# Patient Record
Sex: Male | Born: 1970 | Race: White | Hispanic: No | Marital: Married | Smoking: Current every day smoker
Health system: Southern US, Community
[De-identification: ages and names within clinical notes are randomized; demographics above are authoritative.]

## PROBLEM LIST (undated history)

## (undated) DIAGNOSIS — E119 Type 2 diabetes mellitus without complications: Secondary | ICD-10-CM

---

## 2013-06-02 ENCOUNTER — Encounter (HOSPITAL_COMMUNITY): Payer: Self-pay | Admitting: Emergency Medicine

## 2013-06-02 ENCOUNTER — Emergency Department (HOSPITAL_COMMUNITY)
Admission: EM | Admit: 2013-06-02 | Discharge: 2013-06-02 | Disposition: A | Discharge status: Home / Self Care | Payer: Self-pay | Attending: Emergency Medicine | Admitting: Emergency Medicine

## 2013-06-02 DIAGNOSIS — IMO0002 Reserved for concepts with insufficient information to code with codable children: Secondary | ICD-10-CM | POA: Insufficient documentation

## 2013-06-02 DIAGNOSIS — L03115 Cellulitis of right lower limb: Secondary | ICD-10-CM

## 2013-06-02 DIAGNOSIS — L02419 Cutaneous abscess of limb, unspecified: Secondary | ICD-10-CM | POA: Insufficient documentation

## 2013-06-02 DIAGNOSIS — L03119 Cellulitis of unspecified part of limb: Secondary | ICD-10-CM

## 2013-06-02 DIAGNOSIS — L03113 Cellulitis of right upper limb: Secondary | ICD-10-CM

## 2013-06-02 DIAGNOSIS — F17200 Nicotine dependence, unspecified, uncomplicated: Secondary | ICD-10-CM | POA: Insufficient documentation

## 2013-06-02 MED ORDER — SULFAMETHOXAZOLE-TMP DS 800-160 MG PO TABS
1.0000 | ORAL_TABLET | Freq: Once | ORAL | Status: AC
  Administered 2013-06-02: 1 via ORAL
  Filled 2013-06-02: qty 1

## 2013-06-02 MED ORDER — CEPHALEXIN 250 MG PO CAPS
250.0000 mg | ORAL_CAPSULE | Freq: Once | ORAL | Status: AC
  Administered 2013-06-02: 250 mg via ORAL
  Filled 2013-06-02: qty 1

## 2013-06-02 MED ORDER — SULFAMETHOXAZOLE-TRIMETHOPRIM 800-160 MG PO TABS: 1.0000 | ORAL_TABLET | Freq: Two times a day (BID) | ORAL | Status: DC

## 2013-06-02 MED ORDER — CEPHALEXIN 500 MG PO CAPS: 500.0000 mg | ORAL_CAPSULE | Freq: Four times a day (QID) | ORAL | Status: DC

## 2013-06-02 NOTE — ED Notes (Signed)
Pt woke up with "spider bites" on R wrist and R knee. Areas reddened and swollen. Pt states pain feels like pressure, no itching.

## 2013-06-02 NOTE — ED Provider Notes (Signed)
CSN: [id number]     Arrival date & time 06/02/13  [id number] History  This chart was scribed for non-physician practitioner Emilia BeckKaitlyn Takao Lizer, working with Toy BakerAnthony T Allen, MD by Carl Bestelina Holson, ED Scribe. This patient was seen in room WTR9/WTR9 and the patient's care was started at 8:12 PM.    Chief Complaint  Patient presents with  . Wound Check   Patient is a 43 y.o. male presenting with wound check. The history is provided by the patient. No language interpreter was used.  Wound Check   HPI Comments: Melody Haverimothy R Widen is a 43 y.o. male who presents to the Emergency Department complaining of two painful spider bites located on his right wrist and right knee that he noticed this morning when he woke up.  The patient denies seeing anything bite him.  The patient states that the bites are not itchy or spreading.  He denies fever as an associated symptom.   History reviewed. No pertinent past medical history. History reviewed. No pertinent past surgical history. History reviewed. No pertinent family history. History  Substance Use Topics  . Smoking status: Current Every Day Smoker  . Smokeless tobacco: Not on file  . Alcohol Use: No    Review of Systems  Constitutional: Negative for fever.  Skin: Positive for wound. Negative for rash.  All other systems reviewed and are negative.     Allergies  Review of patient's allergies indicates not on file.  Home Medications   Prior to Admission medications   Not on File   Triage Vitals: BP 154/86  Pulse 104  Temp(Src) 98.4 F (36.9 C) (Oral)  Resp 16  SpO2 100%  Physical Exam  Nursing note and vitals reviewed. Constitutional: He is oriented to person, place, and time. He appears well-developed and well-nourished.  HENT:  Head: Normocephalic and atraumatic.  Eyes: EOM are normal.  Neck: Normal range of motion.  Cardiovascular: Normal rate.   Pulmonary/Chest: Effort normal.  Musculoskeletal: Normal range of motion.  Neurological:  He is alert and oriented to person, place, and time.  Skin: Skin is warm and dry.  4x4 cm area of erythema, localized edema and tenderness to palpation with central scab. No red streaking from the affected area.  No drainage noted.  No open wound.    Psychiatric: He has a normal mood and affect. His behavior is normal.    ED Course  Procedures (including critical care time)  DIAGNOSTIC STUDIES: Oxygen Saturation is 100% on room air, normal by my interpretation.    COORDINATION OF CARE: 8:14 PM- Discussed a clinical suspicion of cellulitis and discharging the patient with Bactrim and Keflex.  The patient denied wanting pain medication.  The patient agreed to the treatment plan.    Labs Review Labs Reviewed - No data to display  Imaging Review No results found.   EKG Interpretation None      MDM   Final diagnoses:  Cellulitis of arm, right  Cellulitis of leg, right    Patient has cellulitis of right arm and right leg. Patient will be discharged with bactrim and keflex. Patient advised to return with worsening or concerning symptoms. Vitals stable and patient afebrile.   I personally performed the services described in this documentation, which was scribed in my presence. The recorded information has been reviewed and is accurate.    Emilia BeckKaitlyn Javad Salva, PA-C 06/02/13 2254

## 2013-06-02 NOTE — Discharge Instructions (Signed)
Take Bactrim and Keflex as directed until gone. Refer to attached documents for more information. Return to the ED with worsening or concerning symptoms.  °

## 2013-06-02 NOTE — ED Provider Notes (Signed)
Medical screening examination/treatment/procedure(s) were performed by non-physician practitioner and as supervising physician I was immediately available for consultation/collaboration.  Shanteria Laye T Trenee Igoe, MD 06/02/13 2309 

## 2013-08-09 ENCOUNTER — Emergency Department (HOSPITAL_COMMUNITY): Payer: Self-pay

## 2013-08-09 ENCOUNTER — Encounter (HOSPITAL_COMMUNITY): Payer: Self-pay | Admitting: Emergency Medicine

## 2013-08-09 ENCOUNTER — Emergency Department (HOSPITAL_COMMUNITY)
Admission: EM | Admit: 2013-08-09 | Discharge: 2013-08-09 | Disposition: A | Discharge status: Home / Self Care | Payer: Self-pay | Source: Home / Self Care | Attending: Emergency Medicine | Admitting: Emergency Medicine

## 2013-08-09 ENCOUNTER — Emergency Department (HOSPITAL_COMMUNITY)
Admission: EM | Admit: 2013-08-09 | Discharge: 2013-08-09 | Disposition: A | Discharge status: Home / Self Care | Payer: Self-pay | Attending: Emergency Medicine | Admitting: Emergency Medicine

## 2013-08-09 ENCOUNTER — Inpatient Hospital Stay (HOSPITAL_COMMUNITY)
Admission: EM | Admit: 2013-08-09 | Discharge: 2013-08-15 | DRG: 854 | Disposition: A | Discharge status: Home / Self Care | Payer: Self-pay | Attending: Internal Medicine | Admitting: Internal Medicine

## 2013-08-09 DIAGNOSIS — K5900 Constipation, unspecified: Secondary | ICD-10-CM | POA: Diagnosis present

## 2013-08-09 DIAGNOSIS — M86149 Other acute osteomyelitis, unspecified hand: Secondary | ICD-10-CM

## 2013-08-09 DIAGNOSIS — E1165 Type 2 diabetes mellitus with hyperglycemia: Secondary | ICD-10-CM | POA: Diagnosis present

## 2013-08-09 DIAGNOSIS — A419 Sepsis, unspecified organism: Secondary | ICD-10-CM | POA: Diagnosis present

## 2013-08-09 DIAGNOSIS — B951 Streptococcus, group B, as the cause of diseases classified elsewhere: Secondary | ICD-10-CM | POA: Diagnosis present

## 2013-08-09 DIAGNOSIS — IMO0001 Reserved for inherently not codable concepts without codable children: Secondary | ICD-10-CM

## 2013-08-09 DIAGNOSIS — E1169 Type 2 diabetes mellitus with other specified complication: Secondary | ICD-10-CM

## 2013-08-09 DIAGNOSIS — M86141 Other acute osteomyelitis, right hand: Secondary | ICD-10-CM

## 2013-08-09 DIAGNOSIS — D72829 Elevated white blood cell count, unspecified: Secondary | ICD-10-CM | POA: Diagnosis present

## 2013-08-09 DIAGNOSIS — A4102 Sepsis due to Methicillin resistant Staphylococcus aureus: Principal | ICD-10-CM | POA: Diagnosis present

## 2013-08-09 DIAGNOSIS — M869 Osteomyelitis, unspecified: Secondary | ICD-10-CM | POA: Diagnosis present

## 2013-08-09 DIAGNOSIS — IMO0002 Reserved for concepts with insufficient information to code with codable children: Secondary | ICD-10-CM | POA: Diagnosis present

## 2013-08-09 DIAGNOSIS — L02519 Cutaneous abscess of unspecified hand: Secondary | ICD-10-CM | POA: Diagnosis present

## 2013-08-09 DIAGNOSIS — L089 Local infection of the skin and subcutaneous tissue, unspecified: Secondary | ICD-10-CM

## 2013-08-09 DIAGNOSIS — E119 Type 2 diabetes mellitus without complications: Secondary | ICD-10-CM | POA: Diagnosis present

## 2013-08-09 DIAGNOSIS — R739 Hyperglycemia, unspecified: Secondary | ICD-10-CM

## 2013-08-09 DIAGNOSIS — F17200 Nicotine dependence, unspecified, uncomplicated: Secondary | ICD-10-CM | POA: Insufficient documentation

## 2013-08-09 DIAGNOSIS — L03119 Cellulitis of unspecified part of limb: Secondary | ICD-10-CM

## 2013-08-09 DIAGNOSIS — M9080 Osteopathy in diseases classified elsewhere, unspecified site: Secondary | ICD-10-CM | POA: Diagnosis present

## 2013-08-09 DIAGNOSIS — I96 Gangrene, not elsewhere classified: Secondary | ICD-10-CM | POA: Diagnosis present

## 2013-08-09 DIAGNOSIS — L98499 Non-pressure chronic ulcer of skin of other sites with unspecified severity: Secondary | ICD-10-CM | POA: Diagnosis present

## 2013-08-09 DIAGNOSIS — Z6833 Body mass index (BMI) 33.0-33.9, adult: Secondary | ICD-10-CM

## 2013-08-09 HISTORY — DX: Type 2 diabetes mellitus without complications: E11.9

## 2013-08-09 LAB — URINALYSIS, ROUTINE W REFLEX MICROSCOPIC
Bilirubin Urine: NEGATIVE
Hgb urine dipstick: NEGATIVE
Ketones, ur: NEGATIVE mg/dL
LEUKOCYTES UA: NEGATIVE
Nitrite: NEGATIVE
PROTEIN: NEGATIVE mg/dL
SPECIFIC GRAVITY, URINE: 1.039 — AB (ref 1.005–1.030)
Urobilinogen, UA: 0.2 mg/dL (ref 0.0–1.0)
pH: 5.5 (ref 5.0–8.0)

## 2013-08-09 LAB — CBC WITH DIFFERENTIAL/PLATELET
Basophils Absolute: 0 10*3/uL (ref 0.0–0.1)
Basophils Relative: 0 % (ref 0–1)
EOS ABS: 0.2 10*3/uL (ref 0.0–0.7)
Eosinophils Relative: 1 % (ref 0–5)
HEMATOCRIT: 41.5 % (ref 39.0–52.0)
Hemoglobin: 14.4 g/dL (ref 13.0–17.0)
Lymphocytes Relative: 17 % (ref 12–46)
Lymphs Abs: 3.1 10*3/uL (ref 0.7–4.0)
MCH: 28.5 pg (ref 26.0–34.0)
MCHC: 34.7 g/dL (ref 30.0–36.0)
MCV: 82 fL (ref 78.0–100.0)
MONO ABS: 1 10*3/uL (ref 0.1–1.0)
Monocytes Relative: 5 % (ref 3–12)
Neutro Abs: 14.2 10*3/uL — ABNORMAL HIGH (ref 1.7–7.7)
Neutrophils Relative %: 77 % (ref 43–77)
PLATELETS: 432 10*3/uL — AB (ref 150–400)
RBC: 5.06 MIL/uL (ref 4.22–5.81)
RDW: 12.9 % (ref 11.5–15.5)
WBC: 18.4 10*3/uL — ABNORMAL HIGH (ref 4.0–10.5)

## 2013-08-09 LAB — COMPREHENSIVE METABOLIC PANEL
ALT: 20 U/L (ref 0–53)
ANION GAP: 18 — AB (ref 5–15)
AST: 12 U/L (ref 0–37)
Albumin: 3.4 g/dL — ABNORMAL LOW (ref 3.5–5.2)
Alkaline Phosphatase: 115 U/L (ref 39–117)
BUN: 13 mg/dL (ref 6–23)
CO2: 20 mEq/L (ref 19–32)
CREATININE: 0.54 mg/dL (ref 0.50–1.35)
Calcium: 9.5 mg/dL (ref 8.4–10.5)
Chloride: 90 mEq/L — ABNORMAL LOW (ref 96–112)
GFR calc Af Amer: >90 mL/min (ref >90)
GLUCOSE: 525 mg/dL — AB (ref 70–99)
Potassium: 4.4 mEq/L (ref 3.7–5.3)
SODIUM: 128 meq/L — AB (ref 137–147)
TOTAL PROTEIN: 7.6 g/dL (ref 6.0–8.3)
Total Bilirubin: <0.2 mg/dL — ABNORMAL LOW (ref 0.3–1.2)

## 2013-08-09 LAB — RAPID URINE DRUG SCREEN, HOSP PERFORMED
AMPHETAMINES: POSITIVE — AB
Barbiturates: NOT DETECTED
Benzodiazepines: NOT DETECTED
Cocaine: NOT DETECTED
OPIATES: NOT DETECTED
Tetrahydrocannabinol: POSITIVE — AB

## 2013-08-09 LAB — URINE MICROSCOPIC-ADD ON

## 2013-08-09 LAB — HEMOGLOBIN A1C
Hgb A1c MFr Bld: 13.5 % — ABNORMAL HIGH (ref <5.7)
MEAN PLASMA GLUCOSE: 341 mg/dL — AB (ref <117)

## 2013-08-09 LAB — CBG MONITORING, ED: GLUCOSE-CAPILLARY: 397 mg/dL — AB (ref 70–99)

## 2013-08-09 MED ORDER — SODIUM CHLORIDE 0.9 % IV SOLN
INTRAVENOUS | Status: DC
  Administered 2013-08-09: 1000 mL via INTRAVENOUS

## 2013-08-09 MED ORDER — SODIUM CHLORIDE 0.9 % IV BOLUS (SEPSIS)
1000.0000 mL | Freq: Once | INTRAVENOUS | Status: AC
  Administered 2013-08-09: 1000 mL via INTRAVENOUS

## 2013-08-09 MED ORDER — INSULIN ASPART 100 UNIT/ML ~~LOC~~ SOLN
12.0000 [IU] | Freq: Once | SUBCUTANEOUS | Status: DC
  Filled 2013-08-09: qty 1

## 2013-08-09 MED ORDER — PIPERACILLIN-TAZOBACTAM 3.375 G IVPB
3.3750 g | Freq: Once | INTRAVENOUS | Status: AC
  Administered 2013-08-09: 3.375 g via INTRAVENOUS
  Filled 2013-08-09: qty 50

## 2013-08-09 NOTE — ED Notes (Signed)
Pt here for redness and swelling to right middle finger, index finger and hand. Pain with some swelling for one month, patient tried to lance his middle finger himself.

## 2013-08-09 NOTE — ED Notes (Signed)
Pt seen earlier today for finger swelling x 1 month. Pt eloped from ED with IV in place. Pt returned. States he got too thirsty and left. IV still in place.

## 2013-08-09 NOTE — ED Notes (Signed)
PT left, refused further evaluation, instructions or vitals.

## 2013-08-09 NOTE — ED Notes (Signed)
Pt states he was [facility] for infected 3rd finger on R hand. Pt states he was advised to stay and be admitted and have finger debrided, but pt states he left AMA because he had to go home. Pt states now he is back and wants to have his finger taken care of. Pt [facility]. Pt has swelling and redness to R hand. Pt arrives with dressing to 3rd finger on R hand.

## 2013-08-09 NOTE — Evaluation (Signed)
Was initially called to admit pt. Re: R finger cellulitis and new onset DM (blood sugar 525).  Was evaluated by ortho earlier today for likely OR evaluation.  Was notified by staff that pt left AMA prior to my admission/evaluation.  Per report, this is the second time today that pt has left the hospital AGAINST MEDICAL ADVICE.

## 2013-08-09 NOTE — ED Provider Notes (Addendum)
CSN: [id number]     Arrival date & time 08/09/13  1135 History   First MD Initiated Contact with Patient 08/09/13 1205     Chief Complaint  Patient presents with  . Hand Pain     (Consider location/radiation/quality/duration/timing/severity/associated sxs/prior Treatment) Patient is a 43 y.o. male presenting with hand pain. The history is provided by the patient.  Hand Pain Pertinent negatives include no chest pain, no abdominal pain, no headaches and no shortness of breath.  pt c/o right middle finger infection for the past month. States started as swelling and erythema to distal aspect of finger.  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  Pt states he attempted to lance himself several weeks ago, but since then pain and swelling have been persistent/worse.  States in past few days, now redness and swelling extend to adjacent index finger and distal hand.  Pain moderate, constant, worse w palpation. No numbness.  No hx diabetes or other chronic illness. No fever or chills. Is right hand dominant.     History reviewed. No pertinent past medical history. History reviewed. No pertinent past surgical history. History reviewed. No pertinent family history. History  Substance Use Topics  . Smoking status: Current Every Day Smoker  . Smokeless tobacco: Not on file  . Alcohol Use: No    Review of Systems  Constitutional: Negative for fever and chills.  HENT: Negative for sore throat.   Eyes: Negative for redness.  Respiratory: Negative for shortness of breath.   Cardiovascular: Negative for chest pain.  Gastrointestinal: Negative for nausea, vomiting and abdominal pain.  Genitourinary: Negative for flank pain.  Musculoskeletal: Negative for back pain and neck pain.  Skin: Negative for rash.  Neurological: Negative for headaches.  Hematological: Does not bruise/bleed easily.  Psychiatric/Behavioral: Negative for confusion.       Allergies  Review of patient's allergies indicates no known allergies.  Home Medications   Prior to Admission medications   Medication Sig Start Date End Date Taking? Authorizing Provider  Naproxen Sodium (ALEVE PO) Take 2 tablets by mouth daily as needed (pain).   Yes Historical Provider, MD  Neomycin-Bacitracin-Polymyxin (NEOSPORIN EX) Apply 1 application topically daily as needed (wound on finger).   Yes Historical Provider, MD   BP 159/93  Pulse 113  Temp(Src) 97.6 F (36.4 C) (Oral)  Resp 20  Wt 245 lb (111.131 kg)  SpO2 96% Physical Exam  Nursing note and vitals reviewed. Constitutional: He is oriented to person, place, and time. He appears well-developed and well-nourished. No distress.  HENT:  Mouth/Throat: Oropharynx is clear and moist.  Eyes: Conjunctivae are normal. No scleral icterus.  Neck: Neck supple. No tracheal deviation present.  Cardiovascular: Normal rate, regular rhythm, normal heart sounds and intact distal pulses.   Pulmonary/Chest: Effort normal and breath sounds normal. No accessory muscle usage. No respiratory distress.  Abdominal: Soft. He exhibits no distension. There is no tenderness.  Musculoskeletal: Normal range of motion.  Diffusely swollen, tender, and erythematous right middle finger.  Ulceration and scabs to distal phalanx of finger. Finger w marked pain w rom. Erythema, swelling and tenderness extend to base of right index finger and distal 2nd-4th metacarpals.   Intact cap refill distally.   Neurological: He is alert and oriented to person, place, and time.  Skin: Skin is warm and dry. He is not diaphoretic.  Psychiatric: He has a normal mood and affect.    ED Course  Procedures (  including critical care time) Labs Review  Results for orders placed during the hospital encounter of 08/09/13  CBC WITH DIFFERENTIAL      Result Value Ref Range   WBC 18.4 (*) 4.0 - 10.5 K/uL   RBC 5.06  4.22 - 5.81 MIL/uL   Hemoglobin 14.4  13.0 -  17.0 g/dL   HCT 40.9  81.1 - 91.4 %   MCV 82.0  78.0 - 100.0 fL   MCH 28.5  26.0 - 34.0 pg   MCHC 34.7  30.0 - 36.0 g/dL   RDW 78.2  95.6 - 21.3 %   Platelets 432 (*) 150 - 400 K/uL   Neutrophils Relative % 77  43 - 77 %   Neutro Abs 14.2 (*) 1.7 - 7.7 K/uL   Lymphocytes Relative 17  12 - 46 %   Lymphs Abs 3.1  0.7 - 4.0 K/uL   Monocytes Relative 5  3 - 12 %   Monocytes Absolute 1.0  0.1 - 1.0 K/uL   Eosinophils Relative 1  0 - 5 %   Eosinophils Absolute 0.2  0.0 - 0.7 K/uL   Basophils Relative 0  0 - 1 %   Basophils Absolute 0.0  0.0 - 0.1 K/uL  COMPREHENSIVE METABOLIC PANEL      Result Value Ref Range   Sodium 128 (*) 137 - 147 mEq/L   Potassium 4.4  3.7 - 5.3 mEq/L   Chloride 90 (*) 96 - 112 mEq/L   CO2 20  19 - 32 mEq/L   Glucose, Bld 525 (*) 70 - 99 mg/dL   BUN 13  6 - 23 mg/dL   Creatinine, Ser 0.86  0.50 - 1.35 mg/dL   Calcium 9.5  8.4 - 57.8 mg/dL   Total Protein 7.6  6.0 - 8.3 g/dL   Albumin 3.4 (*) 3.5 - 5.2 g/dL   AST 12  0 - 37 U/L   ALT 20  0 - 53 U/L   Alkaline Phosphatase 115  39 - 117 U/L   Total Bilirubin <0.2 (*) 0.3 - 1.2 mg/dL   GFR calc non Af Amer >90  >90 mL/min   GFR calc Af Amer >90  >90 mL/min   Anion gap 18 (*) 5 - 15   Dg Finger Middle Right  08/09/2013   CLINICAL DATA:  HAND PAIN  EXAM: RIGHT MIDDLE FINGER 2+V  COMPARISON:  None.  FINDINGS: There is no evidence of fracture or dislocation. The cortical margins appear intact. There is no evidence of arthropathy or other focal bone abnormality. A soft tissue ulceration along the distal tip of the third digit.  IMPRESSION: Soft tissue ulceration overlying the distal tuft of the third digit. There is no evidence of cortical destruction nor acute osseous abnormalities.   Electronically Signed   By: Salome Holmes M.D.   On: 08/09/2013 13:42     MDM  Iv ns. Labs. Xr.  Discussed w hand surgeon, Dr Melvyn Novas, he will see in ED and plan to take to OR for washout.  Pt kept npo.  Iv abx per hand  surgeon/when pt taken to OR.  Reviewed nursing notes and prior charts for additional history.   Pt updated on plan.   From labs, glucose elevated, additional ivf, insulin.  Pt denies hx dm.  hco3 normal. hgbaic added to labs.  Recheck, pt content, alert, in room, awaiting ortho/hand eval and OR.     Suzi Roots, MD 08/09/13 1513     On pts  return to ED arrangements made for admission re new onset dm/hyperglycemia, and plan to go that evening to OR w hand re finger and hand infection/washout.   Pt again elected to leave AMA.  Pt was alert, oriented x 3, appeared to be thinking clearly.  Patient did not appear intoxicated or delusional.  Risks of leaving including worsening infection, loss of and/or amputation of finger and hand, overwhelming whole body infection/sepsis, death, all discussed w pt.  Patient voices his understanding, and risks but insists on leaving AMA.  States he has friend who drove him and that he will be leaving to get into car, to [place] (states here just w work), and that [place] [facility], and that he will drive to hospital immediately once there.  Again tried to convince pt to stay here, get treatment initiated including OR, antibiotics, and control of hyperglycemia, and that then transition of care be made closer to home.  Patient refuses to stay here any longer or consider any other tx, stating that if we dont d/c his iv  In '60 seconds' he will rip it out and walk out.  Pt refusing recommended tx/inpatient stay, and leaves ama - pt unwilling to stay for dressings, paperwork, prescription, etc.        Suzi RootsKevin E Lashae Wollenberg, MD 08/12/13 [phone]

## 2013-08-09 NOTE — ED Notes (Signed)
Pt up to use bathroom. Pt left facility before being discharged. Pt unable to locate at the time.

## 2013-08-09 NOTE — ED Provider Notes (Addendum)
CSN: [id number]     Arrival date & time 08/09/13  1706 History   First MD Initiated Contact with Patient 08/09/13 1725     Chief Complaint  Patient presents with  . Finger Injury     (Consider location/radiation/quality/duration/timing/severity/associated sxs/prior Treatment) The history is provided by the patient.  pt c/o right middle finger infection for the past month. States started as swelling and erythema to distal aspect of finger. 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 Pt states he attempted to lance himself several weeks ago, but since then pain and swelling have been persistent/worse. States in past few days, now redness and swelling extend to adjacent index finger and distal hand. Pain moderate, constant, worse w palpation. No numbness. No hx diabetes or other chronic illness. No fever or chills. Is right hand dominant.   Pt was in ed earlier this afternoon w same, but had left ED ama without notifying staff, did eat/drink when gone, and now returns seeking completion of his care.     History reviewed. No pertinent past medical history. History reviewed. No pertinent past surgical history. No family history on file. History  Substance Use Topics  . Smoking status: Current Every Day Smoker  . Smokeless tobacco: Not on file  . Alcohol Use: No    Review of Systems  Constitutional: Negative for fever and chills.  HENT: Negative for sore throat.   Eyes: Negative for redness.  Respiratory: Negative for shortness of breath.   Cardiovascular: Negative for chest pain.  Gastrointestinal: Negative for vomiting, abdominal pain and diarrhea.  Genitourinary: Negative for flank pain.  Musculoskeletal: Negative for back pain and neck pain.  Skin: Negative for rash.  Neurological: Negative for headaches.  Hematological: Does not bruise/bleed easily.  Psychiatric/Behavioral: Negative for confusion.      Allergies    Review of patient's allergies indicates no known allergies.  Home Medications   Prior to Admission medications   Medication Sig Start Date End Date Taking? Authorizing Provider  Naproxen Sodium (ALEVE PO) Take 2 tablets by mouth daily as needed (pain).    Historical Provider, MD  Neomycin-Bacitracin-Polymyxin (NEOSPORIN EX) Apply 1 application topically daily as needed (wound on finger).    Historical Provider, MD   BP 148/86  Pulse 114  Temp(Src) 98.1 F (36.7 C) (Oral)  Resp 20  SpO2 96% Physical Exam  Nursing note and vitals reviewed. Constitutional: He is oriented to person, place, and time. He appears well-developed and well-nourished. No distress.  HENT:  Mouth/Throat: Oropharynx is clear and moist.  Eyes: Conjunctivae are normal. No scleral icterus.  Neck: Neck supple. No tracheal deviation present.  Cardiovascular: Normal rate, regular rhythm, normal heart sounds and intact distal pulses.   Pulmonary/Chest: Effort normal and breath sounds normal. No accessory muscle usage. No respiratory distress.  Abdominal: Soft. Bowel sounds are normal. He exhibits no distension. There is no tenderness.  Musculoskeletal: Normal range of motion.  Diffusely swollen, tender, and erythematous right middle finger.  Ulceration and scabs to distal phalanx of finger. Finger w marked pain w rom. Erythema, swelling and tenderness extend to base of right index finger and distal 2nd-4th metacarpals.   Intact cap refill distally.     Neurological: He is alert and oriented to person, place, and time.  Skin: Skin is warm and dry. He is not diaphoretic.  Psychiatric: He has a normal mood and affect.    ED Course  Procedures (including critical care  time) Labs Review  Results for orders placed during the hospital encounter of 08/09/13  CBC WITH DIFFERENTIAL      Result Value Ref Range   WBC 18.4 (*) 4.0 - 10.5 K/uL   RBC 5.06  4.22 - 5.81 MIL/uL   Hemoglobin 14.4  13.0 - 17.0 g/dL   HCT 16.141.5   09.639.0 - 04.552.0 %   MCV 82.0  78.0 - 100.0 fL   MCH 28.5  26.0 - 34.0 pg   MCHC 34.7  30.0 - 36.0 g/dL   RDW 40.912.9  81.111.5 - 91.415.5 %   Platelets 432 (*) 150 - 400 K/uL   Neutrophils Relative % 77  43 - 77 %   Neutro Abs 14.2 (*) 1.7 - 7.7 K/uL   Lymphocytes Relative 17  12 - 46 %   Lymphs Abs 3.1  0.7 - 4.0 K/uL   Monocytes Relative 5  3 - 12 %   Monocytes Absolute 1.0  0.1 - 1.0 K/uL   Eosinophils Relative 1  0 - 5 %   Eosinophils Absolute 0.2  0.0 - 0.7 K/uL   Basophils Relative 0  0 - 1 %   Basophils Absolute 0.0  0.0 - 0.1 K/uL  COMPREHENSIVE METABOLIC PANEL      Result Value Ref Range   Sodium 128 (*) 137 - 147 mEq/L   Potassium 4.4  3.7 - 5.3 mEq/L   Chloride 90 (*) 96 - 112 mEq/L   CO2 20  19 - 32 mEq/L   Glucose, Bld 525 (*) 70 - 99 mg/dL   BUN 13  6 - 23 mg/dL   Creatinine, Ser 7.820.54  0.50 - 1.35 mg/dL   Calcium 9.5  8.4 - 95.610.5 mg/dL   Total Protein 7.6  6.0 - 8.3 g/dL   Albumin 3.4 (*) 3.5 - 5.2 g/dL   AST 12  0 - 37 U/L   ALT 20  0 - 53 U/L   Alkaline Phosphatase 115  39 - 117 U/L   Total Bilirubin <0.2 (*) 0.3 - 1.2 mg/dL   GFR calc non Af Amer >90  >90 mL/min   GFR calc Af Amer >90  >90 mL/min   Anion gap 18 (*) 5 - 15   Dg Finger Middle Right  08/09/2013   CLINICAL DATA:  HAND PAIN  EXAM: RIGHT MIDDLE FINGER 2+V  COMPARISON:  None.  FINDINGS: There is no evidence of fracture or dislocation. The cortical margins appear intact. There is no evidence of arthropathy or other focal bone abnormality. A soft tissue ulceration along the distal tip of the third digit.  IMPRESSION: Soft tissue ulceration overlying the distal tuft of the third digit. There is no evidence of cortical destruction nor acute osseous abnormalities.   Electronically Signed   By: Salome HolmesHector  Cooper M.D.   On: 08/09/2013 13:42      MDM  Hand called.  Reviewed nursing notes and prior charts for additional history.          Zosyn iv.  Discussed with hand, Dr Melvyn Novasrtmann, he request admit to med service  given new onset diabetes, blood sugar in 500's, and need for iv abx. Marland Kitchen.  He plans to take to OR tonight for washout.    Unassigned medicine called to admit.    Suzi RootsKevin E Yazlynn Birkeland, MD 08/09/13 [phone]

## 2013-08-09 NOTE — ED Notes (Signed)
Pt refusing to stay, states "if you dont take this IV out I'm gonna take it out." Pt ride is here, [facility] family.

## 2013-08-09 NOTE — ED Notes (Signed)
Attempted to call patient. Pt eloped without having IV removed. Checked lobby for patient. Pt told staff he was going to the bathroom, went in the restroom and then left without telling any staff.

## 2013-08-09 NOTE — ED Notes (Signed)
Pt checked back in with same IV still intact. [facility]. Pt was told earlier, when he refused his insulin, that he was NPO and that his blood sugar was elevated.

## 2013-08-09 NOTE — ED Notes (Signed)
Pt reports pain and swelling to right middle finger x 1 month. Pt attempted to lance his finger at home. Now has redness and swelling into hand and index finger.

## 2013-08-10 ENCOUNTER — Encounter (HOSPITAL_COMMUNITY): Payer: Self-pay | Admitting: Internal Medicine

## 2013-08-10 ENCOUNTER — Inpatient Hospital Stay (HOSPITAL_COMMUNITY): Payer: Self-pay | Admitting: Anesthesiology

## 2013-08-10 ENCOUNTER — Encounter (HOSPITAL_COMMUNITY): Payer: MEDICAID | Admitting: Anesthesiology

## 2013-08-10 ENCOUNTER — Encounter (HOSPITAL_COMMUNITY)
Admission: EM | Disposition: A | Discharge status: Home / Self Care | Payer: Self-pay | Source: Home / Self Care | Attending: Internal Medicine

## 2013-08-10 DIAGNOSIS — A419 Sepsis, unspecified organism: Secondary | ICD-10-CM

## 2013-08-10 DIAGNOSIS — L03019 Cellulitis of unspecified finger: Secondary | ICD-10-CM

## 2013-08-10 DIAGNOSIS — E119 Type 2 diabetes mellitus without complications: Secondary | ICD-10-CM | POA: Diagnosis present

## 2013-08-10 DIAGNOSIS — L089 Local infection of the skin and subcutaneous tissue, unspecified: Secondary | ICD-10-CM

## 2013-08-10 DIAGNOSIS — L98499 Non-pressure chronic ulcer of skin of other sites with unspecified severity: Secondary | ICD-10-CM | POA: Diagnosis present

## 2013-08-10 DIAGNOSIS — L02519 Cutaneous abscess of unspecified hand: Secondary | ICD-10-CM

## 2013-08-10 DIAGNOSIS — L98A399 Non-pressure chronic ulcer of unspecified hand with unspecified severity: Secondary | ICD-10-CM | POA: Diagnosis present

## 2013-08-10 DIAGNOSIS — E1165 Type 2 diabetes mellitus with hyperglycemia: Secondary | ICD-10-CM

## 2013-08-10 DIAGNOSIS — IMO0001 Reserved for inherently not codable concepts without codable children: Secondary | ICD-10-CM | POA: Diagnosis present

## 2013-08-10 HISTORY — PX: I & D EXTREMITY: SHX5045

## 2013-08-10 LAB — CBC
HCT: 38 % — ABNORMAL LOW (ref 39.0–52.0)
Hemoglobin: 12.4 g/dL — ABNORMAL LOW (ref 13.0–17.0)
MCH: 27.7 pg (ref 26.0–34.0)
MCHC: 32.6 g/dL (ref 30.0–36.0)
MCV: 85 fL (ref 78.0–100.0)
Platelets: 371 10*3/uL (ref 150–400)
RBC: 4.47 MIL/uL (ref 4.22–5.81)
RDW: 13.2 % (ref 11.5–15.5)
WBC: 13.7 10*3/uL — ABNORMAL HIGH (ref 4.0–10.5)

## 2013-08-10 LAB — BASIC METABOLIC PANEL
Anion gap: 13 (ref 5–15)
BUN: 11 mg/dL (ref 6–23)
CO2: 23 mEq/L (ref 19–32)
Calcium: 7.9 mg/dL — ABNORMAL LOW (ref 8.4–10.5)
Chloride: 103 mEq/L (ref 96–112)
Creatinine, Ser: 0.48 mg/dL — ABNORMAL LOW (ref 0.50–1.35)
GFR calc Af Amer: >90 mL/min (ref >90)
GLUCOSE: 236 mg/dL — AB (ref 70–99)
Potassium: 3.8 mEq/L (ref 3.7–5.3)
Sodium: 139 mEq/L (ref 137–147)

## 2013-08-10 LAB — GLUCOSE, CAPILLARY
GLUCOSE-CAPILLARY: 260 mg/dL — AB (ref 70–99)
Glucose-Capillary: 285 mg/dL — ABNORMAL HIGH (ref 70–99)
Glucose-Capillary: 213 mg/dL — ABNORMAL HIGH (ref 70–99)
Glucose-Capillary: 176 mg/dL — ABNORMAL HIGH (ref 70–99)
Glucose-Capillary: 220 mg/dL — ABNORMAL HIGH (ref 70–99)

## 2013-08-10 LAB — LIPID PANEL
Cholesterol: 138 mg/dL (ref 0–200)
HDL: 44 mg/dL (ref >39)
LDL Cholesterol: 84 mg/dL (ref 0–99)
TRIGLYCERIDES: 52 mg/dL (ref <150)
Total CHOL/HDL Ratio: 3.1 RATIO
VLDL: 10 mg/dL (ref 0–40)

## 2013-08-10 LAB — CBG MONITORING, ED
GLUCOSE-CAPILLARY: 364 mg/dL — AB (ref 70–99)
GLUCOSE-CAPILLARY: 303 mg/dL — AB (ref 70–99)
Glucose-Capillary: 269 mg/dL — ABNORMAL HIGH (ref 70–99)
Glucose-Capillary: 489 mg/dL — ABNORMAL HIGH (ref 70–99)

## 2013-08-10 SURGERY — Surgical Case
Anesthesia: *Unknown

## 2013-08-10 SURGERY — IRRIGATION AND DEBRIDEMENT EXTREMITY
Anesthesia: General | Laterality: Right

## 2013-08-10 MED ORDER — SODIUM CHLORIDE 0.9 % IV SOLN
INTRAVENOUS | Status: DC
  Administered 2013-08-10 – 2013-08-14 (×6): via INTRAVENOUS
  Administered 2013-08-14: 10 mL/h via INTRAVENOUS

## 2013-08-10 MED ORDER — MIDAZOLAM HCL 5 MG/5ML IJ SOLN
INTRAMUSCULAR | Status: DC | PRN
  Administered 2013-08-10: 2 mg via INTRAVENOUS

## 2013-08-10 MED ORDER — MIDAZOLAM HCL 2 MG/2ML IJ SOLN
INTRAMUSCULAR | Status: AC
  Filled 2013-08-10: qty 2

## 2013-08-10 MED ORDER — FENTANYL CITRATE 0.05 MG/ML IJ SOLN
INTRAMUSCULAR | Status: DC | PRN
  Administered 2013-08-10: 50 ug via INTRAVENOUS

## 2013-08-10 MED ORDER — VANCOMYCIN HCL 10 G IV SOLR
1500.0000 mg | INTRAVENOUS | Status: AC
  Administered 2013-08-10: 1500 mg via INTRAVENOUS
  Filled 2013-08-10: qty 1500

## 2013-08-10 MED ORDER — MEPERIDINE HCL 25 MG/ML IJ SOLN
6.2500 mg | INTRAMUSCULAR | Status: DC | PRN
Start: 2013-08-10 — End: 2013-08-11

## 2013-08-10 MED ORDER — INSULIN GLARGINE 100 UNIT/ML ~~LOC~~ SOLN
15.0000 [IU] | Freq: Every day | SUBCUTANEOUS | Status: DC
Start: 2013-08-10 — End: 2013-08-11
  Administered 2013-08-10 – 2013-08-11 (×2): 15 [IU] via SUBCUTANEOUS
  Filled 2013-08-10 (×2): qty 0.15

## 2013-08-10 MED ORDER — CHLORHEXIDINE GLUCONATE 4 % EX LIQD
60.0000 mL | Freq: Once | CUTANEOUS | Status: AC
  Administered 2013-08-10: 4 via TOPICAL
  Filled 2013-08-10: qty 60

## 2013-08-10 MED ORDER — LIDOCAINE HCL (CARDIAC) 20 MG/ML IV SOLN
INTRAVENOUS | Status: AC
  Filled 2013-08-10: qty 5

## 2013-08-10 MED ORDER — OXYCODONE HCL 5 MG/5ML PO SOLN: 5.0000 mg | Freq: Once | ORAL | Status: DC | PRN

## 2013-08-10 MED ORDER — MORPHINE SULFATE 4 MG/ML IJ SOLN
4.0000 mg | Freq: Once | INTRAMUSCULAR | Status: AC
  Administered 2013-08-10: 4 mg via INTRAVENOUS
  Filled 2013-08-10: qty 1

## 2013-08-10 MED ORDER — MORPHINE SULFATE 2 MG/ML IJ SOLN
2.0000 mg | INTRAMUSCULAR | Status: DC | PRN
  Administered 2013-08-10: 4 mg via INTRAVENOUS
  Filled 2013-08-10: qty 2

## 2013-08-10 MED ORDER — PROPOFOL 10 MG/ML IV BOLUS
INTRAVENOUS | Status: DC | PRN
  Administered 2013-08-10: 150 mg via INTRAVENOUS

## 2013-08-10 MED ORDER — PIPERACILLIN-TAZOBACTAM 3.375 G IVPB: 3.3750 g | Freq: Once | INTRAVENOUS | Status: DC

## 2013-08-10 MED ORDER — PIPERACILLIN-TAZOBACTAM 3.375 G IVPB
3.3750 g | Freq: Three times a day (TID) | INTRAVENOUS | Status: DC
  Administered 2013-08-10 – 2013-08-12 (×8): 3.375 g via INTRAVENOUS
  Filled 2013-08-10 (×12): qty 50

## 2013-08-10 MED ORDER — ONDANSETRON HCL 4 MG/2ML IJ SOLN
INTRAMUSCULAR | Status: AC
  Filled 2013-08-10: qty 2

## 2013-08-10 MED ORDER — LIDOCAINE HCL (CARDIAC) 20 MG/ML IV SOLN
INTRAVENOUS | Status: DC | PRN
  Administered 2013-08-10: 20 mg via INTRAVENOUS

## 2013-08-10 MED ORDER — METOCLOPRAMIDE HCL 5 MG/ML IJ SOLN: 10.0000 mg | Freq: Once | INTRAMUSCULAR | Status: DC | PRN

## 2013-08-10 MED ORDER — HYDROMORPHONE HCL PF 1 MG/ML IJ SOLN: 0.2500 mg | INTRAMUSCULAR | Status: DC | PRN

## 2013-08-10 MED ORDER — HYDROMORPHONE HCL PF 1 MG/ML IJ SOLN
1.0000 mg | INTRAMUSCULAR | Status: DC | PRN
  Administered 2013-08-10 – 2013-08-14 (×29): 1 mg via INTRAVENOUS
  Filled 2013-08-10 (×31): qty 1

## 2013-08-10 MED ORDER — INSULIN ASPART 100 UNIT/ML ~~LOC~~ SOLN
0.0000 [IU] | SUBCUTANEOUS | Status: DC
  Administered 2013-08-10 (×2): 3 [IU] via SUBCUTANEOUS
  Administered 2013-08-10: 5 [IU] via SUBCUTANEOUS
  Administered 2013-08-10: 9 [IU] via SUBCUTANEOUS
  Administered 2013-08-10 (×2): 5 [IU] via SUBCUTANEOUS
  Administered 2013-08-11: 3 [IU] via SUBCUTANEOUS
  Administered 2013-08-11: 7 [IU] via SUBCUTANEOUS
  Administered 2013-08-11 (×2): 5 [IU] via SUBCUTANEOUS
  Filled 2013-08-10 (×2): qty 1

## 2013-08-10 MED ORDER — OXYCODONE HCL 5 MG PO TABS
5.0000 mg | ORAL_TABLET | Freq: Once | ORAL | Status: AC | PRN
  Administered 2013-08-10: 5 mg via ORAL

## 2013-08-10 MED ORDER — PROMETHAZINE HCL 25 MG/ML IJ SOLN: 6.2500 mg | INTRAMUSCULAR | Status: DC | PRN

## 2013-08-10 MED ORDER — HYDROMORPHONE HCL PF 1 MG/ML IJ SOLN
INTRAMUSCULAR | Status: AC
  Filled 2013-08-10: qty 2

## 2013-08-10 MED ORDER — ONDANSETRON HCL 4 MG/2ML IJ SOLN
INTRAMUSCULAR | Status: DC | PRN
  Administered 2013-08-10: 4 mg via INTRAVENOUS

## 2013-08-10 MED ORDER — SODIUM CHLORIDE 0.9 % IV SOLN
INTRAVENOUS | Status: DC | PRN
  Administered 2013-08-10: 18:00:00 via INTRAVENOUS

## 2013-08-10 MED ORDER — HYDROMORPHONE HCL PF 1 MG/ML IJ SOLN
0.2500 mg | INTRAMUSCULAR | Status: DC | PRN
  Administered 2013-08-10 (×2): 0.5 mg via INTRAVENOUS

## 2013-08-10 MED ORDER — PROPOFOL 10 MG/ML IV BOLUS
INTRAVENOUS | Status: AC
  Filled 2013-08-10: qty 20

## 2013-08-10 MED ORDER — SODIUM CHLORIDE 0.9 % IR SOLN
Status: DC | PRN
  Administered 2013-08-10: 1000 mL

## 2013-08-10 MED ORDER — LACTATED RINGERS IV SOLN
INTRAVENOUS | Status: DC | PRN
  Administered 2013-08-10: 18:00:00 via INTRAVENOUS

## 2013-08-10 MED ORDER — FENTANYL CITRATE 0.05 MG/ML IJ SOLN
INTRAMUSCULAR | Status: AC
  Filled 2013-08-10: qty 5

## 2013-08-10 MED ORDER — OXYCODONE HCL 5 MG PO TABS: 5.0000 mg | ORAL_TABLET | Freq: Once | ORAL | Status: DC | PRN

## 2013-08-10 MED ORDER — OXYCODONE HCL 5 MG PO TABS
ORAL_TABLET | ORAL | Status: AC
  Filled 2013-08-10: qty 1

## 2013-08-10 MED ORDER — OXYCODONE HCL 5 MG/5ML PO SOLN: 5.0000 mg | Freq: Once | ORAL | Status: AC | PRN

## 2013-08-10 MED ORDER — LACTATED RINGERS IV SOLN
INTRAVENOUS | Status: DC
  Administered 2013-08-10: 18:00:00 via INTRAVENOUS

## 2013-08-10 MED ORDER — PIPERACILLIN-TAZOBACTAM 3.375 G IVPB 30 MIN
3.3750 g | Freq: Once | INTRAVENOUS | Status: AC
  Administered 2013-08-10: 3.375 g via INTRAVENOUS
  Filled 2013-08-10: qty 50

## 2013-08-10 SURGICAL SUPPLY — 56 items
BANDAGE ELASTIC 3 VELCRO ST LF (GAUZE/BANDAGES/DRESSINGS) ×3 IMPLANT
BANDAGE ELASTIC 4 VELCRO ST LF (GAUZE/BANDAGES/DRESSINGS) ×3 IMPLANT
BANDAGE GAUZE ELAST BULKY 4 IN (GAUZE/BANDAGES/DRESSINGS) ×3 IMPLANT
BNDG COHESIVE 1X5 TAN STRL LF (GAUZE/BANDAGES/DRESSINGS) IMPLANT
BNDG CONFORM 2 STRL LF (GAUZE/BANDAGES/DRESSINGS) IMPLANT
BNDG ESMARK 4X9 LF (GAUZE/BANDAGES/DRESSINGS) ×3 IMPLANT
BNDG GAUZE ELAST 4 BULKY (GAUZE/BANDAGES/DRESSINGS) ×3 IMPLANT
CORDS BIPOLAR (ELECTRODE) ×3 IMPLANT
COVER SURGICAL LIGHT HANDLE (MISCELLANEOUS) ×3 IMPLANT
CUFF TOURNIQUET SINGLE 18IN (TOURNIQUET CUFF) ×3 IMPLANT
CUFF TOURNIQUET SINGLE 24IN (TOURNIQUET CUFF) IMPLANT
DRAIN PENROSE 1/4X12 LTX STRL (WOUND CARE) IMPLANT
DRAPE SURG 17X23 STRL (DRAPES) ×3 IMPLANT
DRSG ADAPTIC 3X8 NADH LF (GAUZE/BANDAGES/DRESSINGS) ×3 IMPLANT
ELECT REM PT RETURN 9FT ADLT (ELECTROSURGICAL)
ELECTRODE REM PT RTRN 9FT ADLT (ELECTROSURGICAL) IMPLANT
GAUZE XEROFORM 1X8 LF (GAUZE/BANDAGES/DRESSINGS) ×3 IMPLANT
GAUZE XEROFORM 5X9 LF (GAUZE/BANDAGES/DRESSINGS) IMPLANT
GLOVE BIOGEL PI IND STRL 8.5 (GLOVE) ×1 IMPLANT
GLOVE BIOGEL PI INDICATOR 8.5 (GLOVE) ×2
GLOVE SURG ORTHO 8.0 STRL STRW (GLOVE) ×3 IMPLANT
GOWN STRL REUS W/ TWL LRG LVL3 (GOWN DISPOSABLE) ×3 IMPLANT
GOWN STRL REUS W/ TWL XL LVL3 (GOWN DISPOSABLE) ×1 IMPLANT
GOWN STRL REUS W/TWL LRG LVL3 (GOWN DISPOSABLE) ×6
GOWN STRL REUS W/TWL XL LVL3 (GOWN DISPOSABLE) ×2
HANDPIECE INTERPULSE COAX TIP (DISPOSABLE)
KIT BASIN OR (CUSTOM PROCEDURE TRAY) ×3 IMPLANT
KIT ROOM TURNOVER OR (KITS) ×3 IMPLANT
MANIFOLD NEPTUNE II (INSTRUMENTS) ×3 IMPLANT
NEEDLE HYPO 25GX1X1/2 BEV (NEEDLE) IMPLANT
NS IRRIG 1000ML POUR BTL (IV SOLUTION) ×3 IMPLANT
PACK ORTHO EXTREMITY (CUSTOM PROCEDURE TRAY) ×3 IMPLANT
PAD ARMBOARD 7.5X6 YLW CONV (MISCELLANEOUS) ×6 IMPLANT
PAD CAST 4YDX4 CTTN HI CHSV (CAST SUPPLIES) ×1 IMPLANT
PADDING CAST COTTON 4X4 STRL (CAST SUPPLIES) ×2
SET CYSTO W/LG BORE CLAMP LF (SET/KITS/TRAYS/PACK) ×3 IMPLANT
SET HNDPC FAN SPRY TIP SCT (DISPOSABLE) IMPLANT
SOAP 2 % CHG 4 OZ (WOUND CARE) ×3 IMPLANT
SPONGE GAUZE 4X4 12PLY (GAUZE/BANDAGES/DRESSINGS) ×3 IMPLANT
SPONGE GAUZE 4X4 12PLY STER LF (GAUZE/BANDAGES/DRESSINGS) ×3 IMPLANT
SPONGE LAP 18X18 X RAY DECT (DISPOSABLE) ×3 IMPLANT
SPONGE LAP 4X18 X RAY DECT (DISPOSABLE) ×3 IMPLANT
SUCTION FRAZIER TIP 10 FR DISP (SUCTIONS) ×3 IMPLANT
SUT ETHILON 4 0 PS 2 18 (SUTURE) IMPLANT
SUT ETHILON 5 0 P 3 18 (SUTURE) ×2
SUT NYLON ETHILON 5-0 P-3 1X18 (SUTURE) ×1 IMPLANT
SUT PROLENE 3 0 PS 2 (SUTURE) ×3 IMPLANT
SYR CONTROL 10ML LL (SYRINGE) IMPLANT
TOWEL OR 17X24 6PK STRL BLUE (TOWEL DISPOSABLE) ×3 IMPLANT
TOWEL OR 17X26 10 PK STRL BLUE (TOWEL DISPOSABLE) ×3 IMPLANT
TUBE ANAEROBIC SPECIMEN COL (MISCELLANEOUS) IMPLANT
TUBE CONNECTING 12'X1/4 (SUCTIONS) ×1
TUBE CONNECTING 12X1/4 (SUCTIONS) ×2 IMPLANT
UNDERPAD 30X30 INCONTINENT (UNDERPADS AND DIAPERS) ×3 IMPLANT
WATER STERILE IRR 1000ML POUR (IV SOLUTION) ×3 IMPLANT
YANKAUER SUCT BULB TIP NO VENT (SUCTIONS) ×3 IMPLANT

## 2013-08-10 NOTE — ED Notes (Signed)
[facility] was called and notified of pt's transfer to [facility]. 

## 2013-08-10 NOTE — H&P (Signed)
[facility] History and Physical  Kevin Woodward ZOX:[id number] DOB: 24-Jan-1971 DOA: 08/09/2013  Referring physician: EDP PCP: No PCP Per Patient   Chief Complaint: Finger infection   HPI: Kevin Woodward is a 43 y.o. male who presents to the ED at [facility].  He has a R middle finger infection and ulcer that has been worsening for the past 1 month.  Pain is constant, worsened with palpation and movement.  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  He also admits to polyuria, polydipsia and polyphagia, that is so severe, that he left the ED AMA to go get some food and something to drink not once, but twice today.  Indeed he also has DM2 which is essentially a new diagnosis for him at this time.  Tried self I&D and since then has had worsening of symptoms.  Review of Systems: Systems reviewed.  As above, otherwise negative  Past Medical History  Diagnosis Date  . DM2 (diabetes mellitus, type 2)     initial daignosis 12 years ago, lost weight and DM has been controlled with diet until now   History reviewed. No pertinent past surgical history. Social History:  reports that he has been smoking.  He does not have any smokeless tobacco history on file. He reports that he does not drink alcohol or use illicit drugs.  No Known Allergies  No family history on file.   Prior to Admission medications   Medication Sig Start Date End Date Taking? Authorizing Provider  Naproxen Sodium (ALEVE PO) Take 2 tablets by mouth daily as needed (pain).    Historical Provider, MD  Neomycin-Bacitracin-Polymyxin (NEOSPORIN EX) Apply 1 application topically daily as needed (wound on finger).    Historical Provider, MD   Physical Exam: Filed Vitals:   08/10/13 0102  BP: 136/91  Pulse: 101  Temp: 98.9 F (37.2 C)  Resp: 18    BP 136/91  Pulse 101  Temp(Src) 98.9 F (37.2 C) (Oral)  Resp 18  SpO2 97%  General Appearance:    Alert,  oriented, no distress, appears stated age  Head:    Normocephalic, atraumatic  Eyes:    PERRL, EOMI, sclera non-icteric        Nose:   Nares without drainage or epistaxis. Mucosa, turbinates normal  Throat:   Moist mucous membranes. Oropharynx without erythema or exudate.  Neck:   Supple. No carotid bruits.  No thyromegaly.  No lymphadenopathy.   Back:     No CVA tenderness, no spinal tenderness  Lungs:     Clear to auscultation bilaterally, without wheezes, rhonchi or rales  Chest wall:    No tenderness to palpitation  Heart:    Regular rate and rhythm without murmurs, gallops, rubs  Abdomen:     Soft, non-tender, nondistended, normal bowel sounds, no organomegaly  Genitalia:    deferred  Rectal:    deferred  Extremities:   No clubbing, cyanosis or edema.  Pulses:   2+ and symmetric all extremities  Skin:   Patient with obvious infection of 3rd finger of R hand, please see Dr. Norman Herrlich second ED note from earlier this evening for photographic pictures of this wound.  Lymph nodes:   Cervical, supraclavicular, and axillary nodes normal  Neurologic:   CNII-XII intact. Normal strength, sensation and reflexes      throughout    Labs on Admission:  Basic Metabolic Panel:  Recent Labs Lab 08/09/13  1230  NA 128*  K 4.4  CL 90*  CO2 20  GLUCOSE 525*  BUN 13  CREATININE 0.54  CALCIUM 9.5   Liver Function Tests:  Recent Labs Lab 08/09/13 1230  AST 12  ALT 20  ALKPHOS 115  BILITOT <0.2*  PROT 7.6  ALBUMIN 3.4*   No results found for this basename: LIPASE, AMYLASE,  in the last 168 hours No results found for this basename: AMMONIA,  in the last 168 hours CBC:  Recent Labs Lab 08/09/13 1230  WBC 18.4*  NEUTROABS 14.2*  HGB 14.4  HCT 41.5  MCV 82.0  PLT 432*   Cardiac Enzymes: No results found for this basename: CKTOTAL, CKMB, CKMBINDEX, TROPONINI,  in the last 168 hours  BNP (last 3 results) No results found for this basename: PROBNP,  in the last 8760  hours CBG:  Recent Labs Lab 08/09/13 1903 08/09/13 2336  GLUCAP 397* 489*    Radiological Exams on Admission: Dg Finger Middle Right  08/09/2013   CLINICAL DATA:  HAND PAIN  EXAM: RIGHT MIDDLE FINGER 2+V  COMPARISON:  None.  FINDINGS: There is no evidence of fracture or dislocation. The cortical margins appear intact. There is no evidence of arthropathy or other focal bone abnormality. A soft tissue ulceration along the distal tip of the third digit.  IMPRESSION: Soft tissue ulceration overlying the distal tuft of the third digit. There is no evidence of cortical destruction nor acute osseous abnormalities.   Electronically Signed   By: Salome HolmesHector  Cooper M.D.   On: 08/09/2013 13:42    EKG: Independently reviewed.  Assessment/Plan Active Problems:   Cellulitis of third finger, right   Ulcer of finger   DM2 (diabetes mellitus, type 2)   1. Ulcer and infection of 3rd finger of R hand - 1. Putting patient on zosyn 2. NPO for surgery tomorrow AM, Dr. Melvyn Novasrtmann will see patient. 3. No evidence of osteomyelitis on plain film. 2. DM2 - 1. Q4H SSI ordered, start metformin on discharge.    Code Status: Full Code  Family Communication: No family present Disposition Plan: Admit to inpatient   Time spent: 70 min  GARDNER, JARED M. [facility] Pager [phone]  If 7AM-7PM, please contact the day team taking care of the patient [email or web address] Password [email or web address] 08/10/2013, 1:25 AM

## 2013-08-10 NOTE — Progress Notes (Addendum)
Inpatient Diabetes Program Recommendations  AACE/ADA: New Consensus Statement on Inpatient Glycemic Control (2013)  Target Ranges:  Prepandial:   less than 140 mg/dL      Peak postprandial:   less than 180 mg/dL (1-2 hours)      Critically ill patients:  140 - 180 mg/dL   Reason for Visit:  Attempted to talk to patient regarding elevated A1C.   Results for Kevin Woodward, Jatavion R (MRN [id number]) as of 08/10/2013 15:47  Ref. Range 08/09/2013 13:50  Hemoglobin A1C Latest Range: <5.7 % 13.5 (H)   Patient was very sleepy and did not talk with me.  Agree with MD that patient will need insulin at discharge in order to meet glucose goals.  Note basal insulin added today.  Will follow.  Beryl MeagerJenny Benjaman Artman, RN, BC-ADM Inpatient Diabetes Coordinator Pager [phone]

## 2013-08-10 NOTE — Progress Notes (Signed)
CHART REVIEWED PT WAS SCHEDULED FOR SURGERY YESTERDAY TWICE PT LEFT THE HOSPITAL TWICE AND SHOWED UP AT [facility] LAST NIGHT AND NOW FORTUNATELY WAS  ADMITTED PLAN FOR SURGERY ON FINGER TODAY  APPRECIATE ASSISTANCE FROM IM AND NEWLY DIAGNOSED DIABETES PREOP ORDERS PLACED

## 2013-08-10 NOTE — Brief Op Note (Signed)
08/09/2013 - 08/10/2013  6:27 PM  PATIENT:  Melody Haverimothy R Dutt  43 y.o. male  PRE-OPERATIVE DIAGNOSIS:  INFECTED RIGHT HAND  POST-OPERATIVE DIAGNOSIS:  INFECTED RIGHT HAND  PROCEDURE:  Procedure(s): IRRIGATION AND DEBRIDEMENT RIGHT MIDDLE FINGER AND HAND (Right)  SURGEON:  Surgeon(s) and Role:    * Sharma CovertFred W Conchita Truxillo, MD - Primary  PHYSICIAN ASSISTANT:   ASSISTANTS: none   ANESTHESIA:   general  EBL:     BLOOD ADMINISTERED:none  DRAINS: none   LOCAL MEDICATIONS USED:  NONE  SPECIMEN:  No Specimen  DISPOSITION OF SPECIMEN:  N/A  COUNTS:  YES  TOURNIQUET:    DICTATION: .[phone]  PLAN OF CARE: Discharge to home after PACU  PATIENT DISPOSITION:  PACU - hemodynamically stable.   Delay start of Pharmacological VTE agent (>24hrs) due to surgical blood loss or risk of bleeding: not applicable

## 2013-08-10 NOTE — Progress Notes (Signed)
UR COMPLETED  

## 2013-08-10 NOTE — Progress Notes (Signed)
POST OP PLAN CONTINUE WITH IV ABX UNTIL WOUND CULTURES COME BACK I WILL LOOK AT WOUND FIRST OF NEXT WEEK. KEEP CURRENT BANDAGE ON AT ALL TIMES CONTACT ME VIA CELL PHONE IF QUESTIONS [phone]  PT HAD AMPUTATION OF RIGHT LONG FINGER AND MULTIPLE I/D OF SEVERAL ABSCESSES  MULTIPLE WOUND CULTURES TAKEN AND BONE CULTURES SENT

## 2013-08-10 NOTE — Progress Notes (Signed)
Pt due to void at 1900, pt attempted to urinate in urine lying in bed and once standing over toliet. Unsuccessful both times, bladder scan showed . Will continue to monitor.

## 2013-08-10 NOTE — ED Notes (Addendum)
Pt's contact: Milda SmartKYLE CARTER (friend)--- tel# [phone] or Room [facility]

## 2013-08-10 NOTE — Progress Notes (Signed)
Pt arrived via [facility] Narda Bonds(Anna Grifitt) at [phone]. Alert and oriented to room, call bell, and bed alarm. Will continue to monitor.

## 2013-08-10 NOTE — ED Provider Notes (Signed)
Medical screening examination/treatment/procedure(s) were performed by non-physician practitioner and as supervising physician I was immediately available for consultation/collaboration.  Keldric Poyer E Sanav Remer, MD 08/10/13 0722 

## 2013-08-10 NOTE — ED Notes (Signed)
[facility] here to transport pt to [facility]. 

## 2013-08-10 NOTE — Anesthesia Postprocedure Evaluation (Signed)
  Anesthesia Post-op Note  Patient: Kevin Woodward  Procedure(s) Performed: Procedure(s): IRRIGATION AND DEBRIDEMENT RIGHT MIDDLE FINGER AND HAND (Right)  Patient Location: PACU  Anesthesia Type:General  Level of Consciousness: awake, alert , oriented and patient cooperative  Airway and Oxygen Therapy: Patient Spontanous Breathing and Patient connected to nasal cannula oxygen  Post-op Pain: none  Post-op Assessment: Post-op Vital signs reviewed, Patient's Cardiovascular Status Stable, Respiratory Function Stable, Patent Airway, No signs of Nausea or vomiting and Pain level controlled  Post-op Vital Signs: Reviewed and stable  Last Vitals:  Filed Vitals:   08/10/13 2030  BP: 148/90  Pulse: 95  Temp: 36.8 C  Resp: 20    Complications: No apparent anesthesia complications

## 2013-08-10 NOTE — Transfer of Care (Signed)
Immediate Anesthesia Transfer of Care Note  Patient: Kevin Woodward  Procedure(s) Performed: Procedure(s): IRRIGATION AND DEBRIDEMENT RIGHT MIDDLE FINGER AND HAND (Right)  Patient Location: PACU  Anesthesia Type:General  Level of Consciousness: awake, sedated and patient cooperative  Airway & Oxygen Therapy: Patient Spontanous Breathing and Patient connected to nasal cannula oxygen  Post-op Assessment: Report given to PACU RN, Post -op Vital signs reviewed and stable and Patient moving all extremities X 4  Post vital signs: Reviewed and stable  Complications: No apparent anesthesia complications

## 2013-08-10 NOTE — Consult Note (Signed)
PT SEEN/EXAMINED AT BEDSIDE PT WITH SEVERAL AREAS  ON HAND CONCERNING FOR DEEP SPACE ABSCESSES. I WOULD RECOMMEND I/D OF RIGHT HAND AND LONG FINGER CHART DOCUMENTS THE IMAGES OF HAND AND FINGER WILL PROCEED WITH I/D TONIGHT R/B/A DISCUSSED WITH PT IN HOLDING AREA.  PT VOICED UNDERSTANDING OF PLAN CONSENT SIGNED DAY OF SURGERY PT SEEN AND EXAMINED PRIOR TO OPERATIVE PROCEDURE/DAY OF SURGERY SITE MARKED. QUESTIONS ANSWERED WILL REMAIN AN INPATIENT FOLLOWING SURGERY

## 2013-08-10 NOTE — Progress Notes (Signed)
Pt being transported to Day Surgery. Pt's CHG bath completed. Pt voided. Vancomycin, consent form sent to Day Surgery with pt. Pt's family with pt.

## 2013-08-10 NOTE — Progress Notes (Signed)
Received report from Alamn at 0213. Room ready and awaiting arrival of pt to 4N29.

## 2013-08-10 NOTE — ED Notes (Signed)
[facility] called and was given report on pt. 

## 2013-08-10 NOTE — Anesthesia Preprocedure Evaluation (Addendum)
Anesthesia Evaluation  Patient identified by MRN, date of birth, ID band Patient awake    Reviewed: Allergy & Precautions, H&P , NPO status , Patient's Chart, lab work & pertinent test results, reviewed documented beta blocker date and time   History of Anesthesia Complications Negative for: history of anesthetic complications  Airway Mallampati: II TM Distance: >3 FB Neck ROM: full    Dental  (+) Dental Advisory Given, Chipped   Pulmonary Current Smoker,  breath sounds clear to auscultation        Cardiovascular - anginanegative cardio ROS  Rhythm:regular Rate:Normal     Neuro/Psych negative neurological ROS  negative psych ROS   GI/Hepatic negative GI ROS, Neg liver ROS,   Endo/Other  diabetes (diet controlled at home, now on insulin, glu 213), Poorly Controlled, Type obesity  Renal/GU negative Renal ROS  negative genitourinary   Musculoskeletal   Abdominal (+) + obese,   Peds  Hematology negative hematology ROS (+)   Anesthesia Other Findings See surgeon's H&P   Reproductive/Obstetrics negative OB ROS                         Anesthesia Physical Anesthesia Plan  ASA: III  Anesthesia Plan: General   Post-op Pain Management:    Induction: Intravenous  Airway Management Planned: LMA  Additional Equipment:   Intra-op Plan:   Post-operative Plan:   Informed Consent: I have reviewed the patients History and Physical, chart, labs and discussed the procedure including the risks, benefits and alternatives for the proposed anesthesia with the patient or authorized representative who has indicated his/her understanding and acceptance.   Dental Advisory Given and Dental advisory given  Plan Discussed with: CRNA and Surgeon  Anesthesia Plan Comments: (Plan routine monitors, GA- LMA OK)       Anesthesia Quick Evaluation

## 2013-08-10 NOTE — Progress Notes (Signed)
PROGRESS NOTE  Kevin Woodward:[id number] DOB: May 25, 1970 DOA: 2013-08-30 PCP: No PCP Per Patient  Interim history 43 year old male with a history of diabetes mellitus diagnosed 10 years ago presented with one month history of right middle finger infection. The patient works as a Actor and feels that the trauma associated with his job he may have resulted in abrasions on his hands. Approximately 3 weeks ago, the patient attempted to lance a wound on his distal third finger with a razor blade. 2 days prior to admission, the patient also attempted to lance it is a wound on his index finger as well as the palm of his hand. The patient presented because of increasing edema, erythema, and pain of his hand. In emergency department, the patient's serum glucose initially was 525 with normal bicarbonate. Unfortunately, the patient left the emergency department against medical advice 2 times before coming back a third time for admission.  Assessment/Plan: Sepsis -Present at the time of admission with tachycardia and leukocytosis -Secondary to hand infection Cellulitis/abscess hand -concerned about underlying osteomyelitis of phlanges -please obtain routine bacterial as well as AFB cultures intraop -send bone for pathology/culture if applicable -continue zosyn Diabetes mellitus, type II, uncontrolled -Hemoglobin A1c 13.5 -Start Lantus 15 units daily--lower dose secondary to n.p.o. status today -Plan to increase long-acting insulin after surgery -NovoLog sliding scale -Patient refuses to start insulin in the outpatient setting--I have discussed the risks, benefits, and alternatives including but not limited to [facility], DKA, CV disease, and renal failure--patient expresses understanding PseudoHyponatremia - Secondary to hyperglycemia -IV fluids -Start insulin [place] Tobacco abuse -Tobacco cessation discussed   Family Communication:   wife at beside Disposition Plan:   Home when medically  stable   Antibiotics:  Zosyn  2013-08-30>>>         Procedures/Studies: Dg Finger Middle Right  08-30-2013   CLINICAL DATA:  HAND PAIN  EXAM: RIGHT MIDDLE FINGER 2+V  COMPARISON:  None.  FINDINGS: There is no evidence of fracture or dislocation. The cortical margins appear intact. There is no evidence of arthropathy or other focal bone abnormality. A soft tissue ulceration along the distal tip of the third digit.  IMPRESSION: Soft tissue ulceration overlying the distal tuft of the third digit. There is no evidence of cortical destruction nor acute osseous abnormalities.   Electronically Signed   By: Salome Holmes M.D.   On: 30-Aug-2013 13:42         Subjective: Patient complains of pain in his right middle finger as well as right index finger. Patient denies fevers, chills, headache, chest pain, dyspnea, nausea, vomiting, diarrhea, abdominal pain, dysuria, hematuria   Objective: Filed Vitals:   08/10/13 0139 08/10/13 0215 08/10/13 0235 08/10/13 0511  BP: 134/83  143/83 134/79  Pulse: 97  95 98  Temp: 98.9 F (37.2 C)   98.6 F (37 C)  TempSrc: Oral   Oral  Resp: 18  18 18   Height:  5\' 9"  (1.753 m)  5\' 9"  (1.753 m)  Weight:  111.131 kg (245 lb)  103.692 kg (228 lb 9.6 oz)  SpO2: 97%  95% 97%   No intake or output data in the 24 hours ending 08/10/13 0833 Weight change:  Exam:   General:  Pt is alert, follows commands appropriately, not in acute distress  HEENT: No icterus, No thrush,  [place]/AT  Cardiovascular: RRR, S1/S2, no rubs, no gallops  Respiratory: CTA bilaterally, no wheezing, no crackles, no rhonchi  Abdomen: Soft/+BS, non tender, non distended, no guarding  Extremities: Right middle finger distal phalanx with necrotic changes and yellow drainage of the distal phalanx with edema extending to the metacarpophalangeal joint. There is erythema and edema of the right index finger extending from the PIP to the metacarpophalangeal joint. No crepitance.  Data  Reviewed: Basic Metabolic Panel:  Recent Labs Lab 08/09/13 1230  NA 128*  K 4.4  CL 90*  CO2 20  GLUCOSE 525*  BUN 13  CREATININE 0.54  CALCIUM 9.5   Liver Function Tests:  Recent Labs Lab 08/09/13 1230  AST 12  ALT 20  ALKPHOS 115  BILITOT <0.2*  PROT 7.6  ALBUMIN 3.4*   No results found for this basename: LIPASE, AMYLASE,  in the last 168 hours No results found for this basename: AMMONIA,  in the last 168 hours CBC:  Recent Labs Lab 08/09/13 1230  WBC 18.4*  NEUTROABS 14.2*  HGB 14.4  HCT 41.5  MCV 82.0  PLT 432*   Cardiac Enzymes: No results found for this basename: CKTOTAL, CKMB, CKMBINDEX, TROPONINI,  in the last 168 hours BNP: No components found with this basename: POCBNP,  CBG:  Recent Labs Lab 08/09/13 2336 08/10/13 0128 08/10/13 0233 08/10/13 0339 08/10/13 0800  GLUCAP 489* 364* 303* 269* 285*    No results found for this or any previous visit (from the past 240 hour(s)).   Scheduled Meds: . insulin aspart  0-9 Units Subcutaneous 6 times per day  . insulin glargine  15 Units Subcutaneous Daily  . piperacillin-tazobactam (ZOSYN)  IV  3.375 g Intravenous 3 times per day  . vancomycin  1,500 mg Intravenous On Call to OR   Continuous Infusions: . sodium chloride 125 mL/hr at 08/10/13 0135     Jyra Lagares, DO  [facility] Pager [phone]  If 7PM-7AM, please contact night-coverage [email or web address] Password [email or web address] 08/10/2013, 8:33 AM   LOS: 1 day

## 2013-08-10 NOTE — Progress Notes (Signed)
[facility] Pt to be transported to Day Surgery shortly.

## 2013-08-10 NOTE — ED Provider Notes (Signed)
CSN: [id number]     Arrival date & time 08/09/13  2258 History   First MD Initiated Contact with Patient 08/09/13 2324     Chief Complaint  Patient presents with  . Infected Finger      (Consider location/radiation/quality/duration/timing/severity/associated sxs/prior Treatment) HPI  Patient presents to the emergency department with chief complaint of finger infection. He states that he has had an infection for the past month. States that the pain is constant. It is worsened with palpation and movement. He was seen earlier today, and was admitted to the hospital for surgery, however he then left AMA to go eat dinner.  He states that he has had the infection for the past month.  He has tried self I&D.  He states that he has since had worsening of symptoms.    History reviewed. No pertinent past medical history. History reviewed. No pertinent past surgical history. No family history on file. History  Substance Use Topics  . Smoking status: Current Every Day Smoker  . Smokeless tobacco: Not on file  . Alcohol Use: No    Review of Systems    Allergies  Review of patient's allergies indicates no known allergies.  Home Medications   Prior to Admission medications   Medication Sig Start Date End Date Taking? Authorizing Provider  Naproxen Sodium (ALEVE PO) Take 2 tablets by mouth daily as needed (pain).    Historical Provider, MD  Neomycin-Bacitracin-Polymyxin (NEOSPORIN EX) Apply 1 application topically daily as needed (wound on finger).    Historical Provider, MD   BP 156/90  Pulse 124  Temp(Src) 99 F (37.2 C) (Oral)  Resp 17  SpO2 97% Physical Exam  Nursing note and vitals reviewed. Constitutional: He is oriented to person, place, and time. He appears well-developed and well-nourished.  HENT:  Head: Normocephalic and atraumatic.  Eyes: Conjunctivae and EOM are normal. Pupils are equal, round, and reactive to light. Right eye exhibits no discharge. Left eye exhibits no  discharge. No scleral icterus.  Neck: Normal range of motion. Neck supple. No JVD present.  Cardiovascular: Regular rhythm and normal heart sounds.  Exam reveals no gallop and no friction rub.   No murmur heard. Tachycardia  Intact Refill, intact distal pulses  Pulmonary/Chest: Effort normal and breath sounds normal. No respiratory distress. He has no wheezes. He has no rales. He exhibits no tenderness.  Abdominal: Soft. He exhibits no distension and no mass. There is no tenderness. There is no rebound and no guarding.  Musculoskeletal: Normal range of motion. He exhibits no edema and no tenderness.  Erythematous, swollen middle finger, with ulceration of the distal tip  Associated erythema of the base of the right index finger and distal 2-4 metacarpals  Neurological: He is alert and oriented to person, place, and time.  Sensation intact  Skin: Skin is warm and dry.  Psychiatric: He has a normal mood and affect. His behavior is normal. Judgment and thought content normal.    ED Course  Procedures (including critical care time) Labs Review Labs Reviewed  CBG MONITORING, ED    Imaging Review Dg Finger Middle Right  08/09/2013   CLINICAL DATA:  HAND PAIN  EXAM: RIGHT MIDDLE FINGER 2+V  COMPARISON:  None.  FINDINGS: There is no evidence of fracture or dislocation. The cortical margins appear intact. There is no evidence of arthropathy or other focal bone abnormality. A soft tissue ulceration along the distal tip of the third digit.  IMPRESSION: Soft tissue ulceration overlying the distal tuft of  the third digit. There is no evidence of cortical destruction nor acute osseous abnormalities.   Electronically Signed   By: Salome Holmes M.D.   On: 08/09/2013 13:42     EKG Interpretation None      MDM   Final diagnoses:  Finger infection    Patient seen twice today.  Medications  piperacillin-tazobactam (ZOSYN) IVPB 3.375 g (3.375 g Intravenous New Bag/Given 08/10/13 0101)   morphine 4 MG/ML injection 4 mg (4 mg Intravenous Given 08/10/13 0028)   Filed Vitals:   08/10/13 0102  BP: 136/91  Pulse: 101  Temp: 98.9 F (37.2 C)  Resp: 18     Patient will be admitted to the hospital, transferred to [facility], and be seen by hand surgery in the morning.    Roxy Horseman, PA-C 08/10/13 [phone]

## 2013-08-11 ENCOUNTER — Encounter (HOSPITAL_COMMUNITY): Payer: Self-pay | Admitting: Orthopedic Surgery

## 2013-08-11 DIAGNOSIS — IMO0001 Reserved for inherently not codable concepts without codable children: Secondary | ICD-10-CM

## 2013-08-11 DIAGNOSIS — E1165 Type 2 diabetes mellitus with hyperglycemia: Secondary | ICD-10-CM

## 2013-08-11 DIAGNOSIS — M86149 Other acute osteomyelitis, unspecified hand: Secondary | ICD-10-CM

## 2013-08-11 LAB — GLUCOSE, CAPILLARY
Glucose-Capillary: 229 mg/dL — ABNORMAL HIGH (ref 70–99)
Glucose-Capillary: 257 mg/dL — ABNORMAL HIGH (ref 70–99)
Glucose-Capillary: 262 mg/dL — ABNORMAL HIGH (ref 70–99)
Glucose-Capillary: 282 mg/dL — ABNORMAL HIGH (ref 70–99)
Glucose-Capillary: 288 mg/dL — ABNORMAL HIGH (ref 70–99)
Glucose-Capillary: 320 mg/dL — ABNORMAL HIGH (ref 70–99)

## 2013-08-11 LAB — CBC
HEMATOCRIT: 38.6 % — AB (ref 39.0–52.0)
Hemoglobin: 12.4 g/dL — ABNORMAL LOW (ref 13.0–17.0)
MCH: 27.6 pg (ref 26.0–34.0)
MCHC: 32.1 g/dL (ref 30.0–36.0)
MCV: 85.8 fL (ref 78.0–100.0)
Platelets: 382 10*3/uL (ref 150–400)
RBC: 4.5 MIL/uL (ref 4.22–5.81)
RDW: 13.1 % (ref 11.5–15.5)
WBC: 15.3 10*3/uL — AB (ref 4.0–10.5)

## 2013-08-11 LAB — BASIC METABOLIC PANEL
Anion gap: 14 (ref 5–15)
BUN: 11 mg/dL (ref 6–23)
CO2: 24 meq/L (ref 19–32)
Calcium: 8.3 mg/dL — ABNORMAL LOW (ref 8.4–10.5)
Chloride: 97 mEq/L (ref 96–112)
Creatinine, Ser: 0.65 mg/dL (ref 0.50–1.35)
GFR calc Af Amer: >90 mL/min (ref >90)
GFR calc non Af Amer: >90 mL/min (ref >90)
Glucose, Bld: 233 mg/dL — ABNORMAL HIGH (ref 70–99)
Potassium: 3.9 mEq/L (ref 3.7–5.3)
SODIUM: 135 meq/L — AB (ref 137–147)

## 2013-08-11 MED ORDER — NICOTINE 21 MG/24HR TD PT24
21.0000 mg | MEDICATED_PATCH | Freq: Every day | TRANSDERMAL | Status: DC
  Administered 2013-08-11: 21 mg via TRANSDERMAL
  Filled 2013-08-11 (×2): qty 1

## 2013-08-11 MED ORDER — VANCOMYCIN HCL IN DEXTROSE 1-5 GM/200ML-% IV SOLN
1000.0000 mg | Freq: Two times a day (BID) | INTRAVENOUS | Status: DC
  Administered 2013-08-12 – 2013-08-13 (×4): 1000 mg via INTRAVENOUS
  Filled 2013-08-11 (×6): qty 200

## 2013-08-11 MED ORDER — INSULIN ASPART 100 UNIT/ML ~~LOC~~ SOLN
0.0000 [IU] | Freq: Every day | SUBCUTANEOUS | Status: DC
  Administered 2013-08-11: 3 [IU] via SUBCUTANEOUS

## 2013-08-11 MED ORDER — SODIUM CHLORIDE 0.9 % IV SOLN
2000.0000 mg | Freq: Once | INTRAVENOUS | Status: AC
  Administered 2013-08-11: 2000 mg via INTRAVENOUS
  Filled 2013-08-11: qty 2000

## 2013-08-11 MED ORDER — LIVING WELL WITH DIABETES BOOK
Freq: Once | Status: AC
  Administered 2013-08-11: 16:00:00
  Filled 2013-08-11: qty 1

## 2013-08-11 MED ORDER — INSULIN ASPART 100 UNIT/ML ~~LOC~~ SOLN
0.0000 [IU] | Freq: Three times a day (TID) | SUBCUTANEOUS | Status: DC
  Administered 2013-08-11 – 2013-08-12 (×3): 8 [IU] via SUBCUTANEOUS
  Administered 2013-08-12: 17:00:00 via SUBCUTANEOUS
  Administered 2013-08-13: 5 [IU] via SUBCUTANEOUS
  Administered 2013-08-13: 3 [IU] via SUBCUTANEOUS
  Administered 2013-08-13: 11 [IU] via SUBCUTANEOUS
  Administered 2013-08-14: 3 [IU] via SUBCUTANEOUS
  Administered 2013-08-14: 5 [IU] via SUBCUTANEOUS
  Administered 2013-08-14: 3 [IU] via SUBCUTANEOUS

## 2013-08-11 MED ORDER — INSULIN ASPART PROT & ASPART (70-30 MIX) 100 UNIT/ML ~~LOC~~ SUSP
20.0000 [IU] | Freq: Two times a day (BID) | SUBCUTANEOUS | Status: DC
  Administered 2013-08-12 – 2013-08-13 (×3): 20 [IU] via SUBCUTANEOUS
  Filled 2013-08-11: qty 10

## 2013-08-11 NOTE — Progress Notes (Signed)
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 and is unable to eat a health heart diet and just grabs what he can ( little Debbie cakes, burgers...); Brayton CavesJessie stated that she will help the patient work on his diet and encourage follow up care; [facility] given [place] that they will see the patient and they accept walk in patient's without apt; CM is unable to make an apt due to the patient [place] completion of [place] and does not know where his job will send him for his next assignment; Brayton CavesJessie also stated that at discharge, he will be able to get his prescriptions filled - [facility]; B Kyera Felan RN,BSN,MHA [phone]

## 2013-08-11 NOTE — Progress Notes (Signed)
PROGRESS NOTE  Kevin Woodward WUJ:[id number] DOB: 1970/11/13 DOA: 24-Aug-2013 PCP: No PCP Per Patient  Assessment/Plan: Sepsis  -Present at the time of admission with tachycardia and leukocytosis  -Secondary to hand infection  Osteomyelitis of Phalanx -add IV vanco pending culture data -discussed intraoperative findings with Dr. Melvyn Novas -s/p I&D 08/10/13-->follow cultures Cellulitis/abscesss hand and digits -continue zosyn  -added vanco Diabetes mellitus, type II, uncontrolled  -Hemoglobin A1c 13.5  -switch to 70/30 insulin due to pt financial situation and cost of Lantus -NovoLog sliding scale  -Patient now agrees to taking insulin -Diabetic coordinator has provided education PseudoHyponatremia  -improved with IVF and tx of DM - Secondary to hyperglycemia  -IV fluids  Tobacco abuse  -Tobacco cessation discussed  -nicoderm patch Family Communication: wife at beside  Disposition Plan: Home when medically stable  Antibiotics:  Zosyn Aug 24, 2013>>> Vanco 08/11/13>>>           Procedures/Studies: Dg Finger Middle Right  08/24/2013   CLINICAL DATA:  HAND PAIN  EXAM: RIGHT MIDDLE FINGER 2+V  COMPARISON:  None.  FINDINGS: There is no evidence of fracture or dislocation. The cortical margins appear intact. There is no evidence of arthropathy or other focal bone abnormality. A soft tissue ulceration along the distal tip of the third digit.  IMPRESSION: Soft tissue ulceration overlying the distal tuft of the third digit. There is no evidence of cortical destruction nor acute osseous abnormalities.   Electronically Signed   By: Salome Holmes M.D.   On: 08/24/2013 13:42         Subjective: Patient states that pain in his right upper extremity is better controlled today.  Patient denies fevers, chills, headache, chest pain, dyspnea, nausea, vomiting, diarrhea, abdominal pain, dysuria, hematuria   Objective: Filed Vitals:   08/11/13 0601 08/11/13 1007 08/11/13 1452  08/11/13 1810  BP: 157/83 130/82 129/78 131/75  Pulse: 97 88 85 89  Temp: 99.9 F (37.7 C) 98.6 F (37 C) 99.1 F (37.3 C) 98.5 F (36.9 C)  TempSrc: Oral Oral Oral Oral  Resp: 20 20 20 20   Height:      Weight:      SpO2: 97% 92% 99% 97%    Intake/Output Summary (Last 24 hours) at 08/11/13 1959 Last data filed at 08/11/13 1700  Gross per 24 hour  Intake    620 ml  Output      0 ml  Net    620 ml   Weight change:  Exam:   General:  Pt is alert, follows commands appropriately, not in acute distress  HEENT: No icterus, No thrush,  [place]/AT  Cardiovascular: RRR, S1/S2, no rubs, no gallops  Respiratory: CTA bilaterally, no wheezing, no crackles, no rhonchi  Abdomen: Soft/+BS, non tender, non distended, no guarding Extremities: RUE in bulky dressing.  No CCE in LEs Data Reviewed: Basic Metabolic Panel:  Recent Labs Lab 08-24-13 1230 08/10/13 0940 08/11/13 0722  NA 128* 139 135*  K 4.4 3.8 3.9  CL 90* 103 97  CO2 20 23 24   GLUCOSE 525* 236* 233*  BUN 13 11 11   CREATININE 0.54 0.48* 0.65  CALCIUM 9.5 7.9* 8.3*   Liver Function Tests:  Recent Labs Lab 08-24-2013 1230  AST 12  ALT 20  ALKPHOS 115  BILITOT <0.2*  PROT 7.6  ALBUMIN 3.4*   No results found for this basename: LIPASE, AMYLASE,  in the last 168 hours No results found for this basename: AMMONIA,  in the last 168 hours CBC:  Recent Labs Lab 08/09/13 1230 08/10/13 0940 08/11/13 0722  WBC 18.4* 13.7* 15.3*  NEUTROABS 14.2*  --   --   HGB 14.4 12.4* 12.4*  HCT 41.5 38.0* 38.6*  MCV 82.0 85.0 85.8  PLT 432* 371 382   Cardiac Enzymes: No results found for this basename: CKTOTAL, CKMB, CKMBINDEX, TROPONINI,  in the last 168 hours BNP: No components found with this basename: POCBNP,  CBG:  Recent Labs Lab 08/11/13 0025 08/11/13 0452 08/11/13 0822 08/11/13 1157 08/11/13 1651  GLUCAP 262* 320* 288* 229* 282*    Recent Results (from the past 240 hour(s))  WOUND CULTURE     Status: None    Collection Time    08/10/13  6:55 PM      Result Value Ref Range Status   Specimen Description WOUND HAND RIGHT   Final   Special Requests NONE   Final   Gram Stain     Final   Value: RARE WBC PRESENT,BOTH PMN AND MONONUCLEAR     NO SQUAMOUS EPITHELIAL CELLS SEEN     NO ORGANISMS SEEN     Performed at [facility]   Culture     Final   Value: NO GROWTH     Performed at [facility]   Report Status PENDING   Incomplete  ANAEROBIC CULTURE     Status: None   Collection Time    08/10/13  6:55 PM      Result Value Ref Range Status   Specimen Description WOUND HAND RIGHT   Final   Special Requests NONE   Final   Gram Stain     Final   Value: RARE WBC PRESENT,BOTH PMN AND MONONUCLEAR     NO SQUAMOUS EPITHELIAL CELLS SEEN     NO ORGANISMS SEEN     Performed at [facility]   Culture     Final   Value: NO ANAEROBES ISOLATED; CULTURE IN PROGRESS FOR 5 DAYS     Performed at [facility]   Report Status PENDING   Incomplete  TISSUE CULTURE     Status: None   Collection Time    08/10/13  6:55 PM      Result Value Ref Range Status   Specimen Description TISSUE FINGER RIGHT   Final   Special Requests NONE   Final   Gram Stain     Final   Value: ABUNDANT WBC PRESENT, PREDOMINANTLY PMN     FEW GRAM POSITIVE COCCI     IN PAIRS IN CLUSTERS     Performed at [facility]   Culture     Final   Value: NO GROWTH     Performed at [facility]   Report Status PENDING   Incomplete  ANAEROBIC CULTURE     Status: None   Collection Time    08/10/13  6:55 PM      Result Value Ref Range Status   Specimen Description TISSUE FINGER RIGHT   Final   Special Requests NONE   Final   Gram Stain     Final   Value: ABUNDANT WBC PRESENT,BOTH PMN AND MONONUCLEAR     FEW GRAM POSITIVE COCCI     IN PAIRS IN CLUSTERS     Performed at [facility]   Culture     Final   Value: NO ANAEROBES ISOLATED; CULTURE IN PROGRESS FOR 5 DAYS     Performed at  [facility]   Report Status PENDING   Incomplete  WOUND CULTURE     Status: None   Collection Time    08/10/13  6:56 PM      Result Value Ref Range Status   Specimen Description WOUND FINGER RIGHT   Final   Special Requests INDEX FINGER TIP   Final   Gram Stain     Final   Value: RARE WBC PRESENT, PREDOMINANTLY PMN     NO SQUAMOUS EPITHELIAL CELLS SEEN     FEW GRAM POSITIVE COCCI     IN PAIRS     Performed at [facility]   Culture     Final   Value: Culture reincubated for better growth     Performed at [facility]   Report Status PENDING   Incomplete  ANAEROBIC CULTURE     Status: None   Collection Time    08/10/13  6:56 PM      Result Value Ref Range Status   Specimen Description WOUND FINGER RIGHT   Final   Special Requests INDEX FINGER TIP   Final   Gram Stain     Final   Value: RARE WBC PRESENT, PREDOMINANTLY PMN     NO SQUAMOUS EPITHELIAL CELLS SEEN     FEW GRAM POSITIVE COCCI     IN PAIRS     Performed at [facility]   Culture     Final   Value: NO ANAEROBES ISOLATED; CULTURE IN PROGRESS FOR 5 DAYS     Performed at [facility]   Report Status PENDING   Incomplete  ANAEROBIC CULTURE     Status: None   Collection Time    08/10/13  6:58 PM      Result Value Ref Range Status   Specimen Description TISSUE BONE FINGER RIGHT   Final   Special Requests NONE   Final   Gram Stain     Final   Value: NO WBC SEEN     FEW GRAM POSITIVE COCCI     IN PAIRS     Performed at [facility]   Culture     Final   Value: NO ANAEROBES ISOLATED; CULTURE IN PROGRESS FOR 5 DAYS     Performed at [facility]   Report Status PENDING   Incomplete  TISSUE CULTURE     Status: None   Collection Time    08/10/13  6:58 PM      Result Value Ref Range Status   Specimen Description TISSUE BONE FINGER RIGHT   Final   Special Requests NONE   Final   Gram Stain     Final   Value: NO WBC SEEN     FEW GRAM POSITIVE COCCI     IN PAIRS       Performed at [facility]   Culture     Final   Value: Culture reincubated for better growth     Performed at [facility]   Report Status PENDING   Incomplete     Scheduled Meds: . insulin aspart  0-15 Units Subcutaneous TID WC  . insulin aspart  0-5 Units Subcutaneous QHS  . insulin glargine  15 Units Subcutaneous Daily  . nicotine  21 mg Transdermal Daily  . piperacillin-tazobactam (ZOSYN)  IV  3.375 g Intravenous 3 times per day  . [START ON 08/12/2013] vancomycin  1,000 mg Intravenous Q12H   Continuous Infusions: . sodium chloride 125 mL/hr at  08/10/13 1506  . lactated ringers 50 mL/hr at 08/10/13 1805     Lucile Didonato, DO  [facility] Pager [phone]  If 7PM-7AM, please contact night-coverage [email or web address] Password [email or web address] 08/11/2013, 7:59 PM   LOS: 2 days

## 2013-08-11 NOTE — Plan of Care (Signed)
Problem: Food- and Nutrition-Related Knowledge Deficit (NB-1.1) Goal: Nutrition education Formal process to instruct or train a patient/client in a skill or to impart knowledge to help patients/clients voluntarily manage or modify food choices and eating behavior to maintain or improve health. Outcome: Completed/Met Date Met:  08/11/13  RD consulted for nutrition education regarding diabetes. Patient has just been seen by diabetes coordinator. Pt reports that he knows what he needs to do to lose weight, as he has already lost 100 lb, however he just needs to focus on continuing to lose. Pt not very attentive during my discussion, asking RD for a nicotine patch (informed RN.) Pt denied any further questions regarding nutrition at this time.    Lab Results  Component Value Date    HGBA1C 13.5* 08/09/2013    RD provided "Carbohydrate Counting for People with Diabetes" handout from the Academy of Nutrition and Dietetics. Discussed different food groups and their effects on blood sugar, emphasizing carbohydrate-containing foods. Provided list of carbohydrates and recommended serving sizes of common foods.  Discussed importance of controlled and consistent carbohydrate intake throughout the day. Provided examples of ways to balance meals/snacks and encouraged intake of high-fiber, whole grain complex carbohydrates. Teach back method used.  Expect fiar compliance.  Body mass index is 33.74 kg/(m^2). Pt meets criteria for Obese Class I based on current BMI.  Current diet order is Carbohydrate Modified Medium, patient is consuming approximately >75% of meals at this time. Labs and medications reviewed. No further nutrition interventions warranted at this time. RD contact information provided. If additional nutrition issues arise, please re-consult RD.  Inda Coke MS, RD, LDN Inpatient Registered Dietitian Pager: [phone] After-hours pager: [phone]

## 2013-08-11 NOTE — Progress Notes (Addendum)
Inpatient Diabetes Program Recommendations  AACE/ADA: New Consensus Statement on Inpatient Glycemic Control (2013)  Target Ranges:  Prepandial:   less than 140 mg/dL      Peak postprandial:   less than 180 mg/dL (1-2 hours)      Critically ill patients:  140 - 180 mg/dL   Results for Kevin Woodward, Arth R (MRN [id number]) as of 08/11/2013 10:44  Ref. Range 08/10/2013 08:00 08/10/2013 11:27 08/10/2013 16:34 08/10/2013 19:50 08/10/2013 21:11 08/11/2013 00:25 08/11/2013 04:52 08/11/2013 08:22  Glucose-Capillary Latest Range: 70-99 mg/dL 409285 (H) 811260 (H) 914213 (H) 176 (H) 220 (H) 262 (H) 320 (H) 288 (H)   Diabetes history: DM2 Outpatient Diabetes medications: None Current orders for Inpatient glycemic control: Lantus 15 units daily, Novolog 0-9 units Q4H  Inpatient Diabetes Program Recommendations Insulin - Basal: Please consider increasing Lantus to 30 units daily (if ordered, give a one time dose of Lantus 15 units now since patient has already received Lantus 15 units this morning for a total of 30 units for today). Correction (SSI): Please consider increasing Novolog correction to moderate scale.   08/11/13@13 :50-Talked with patient regarding diabetes and treatment options.  Patient reports that he was told he had diabetes about 10 years ago.  He states that he was prescribed Metformin but never started the medications because of all the side effects to it.  He reports that once he was told he decided to lose weight and has lost over 100 lbs since he was diagnosed with DM 10 years ago. Patient also reports that shortly after he was diagnosed he got his blood glucose down to 124 mg/dl and he felt awful and was not able to function due to feelings of hypoglycemia.  Inquired about knowledge about A1C and patient reports that he does not know what an A1C is. Discussed A1C results (13.5% on 08/09/13) and explained what an A1C is, basic pathophysiology of DM Type 2, basic home care, importance of checking CBGs and maintaining  good CBG control to prevent long-term and short-term complications. Discussed impact of nutrition, exercise, stress, sickness, and medications on diabetes control.  Discussed carbohydrates, carbohydrate goals per day and meal, along with portion sizes. Patient reports that he was eating and drinking whatever he wanted and going forward he plans to change his diet and stop drinking sugary drinks and cut down on his carbohydrates. Inquired about patient's willingness to take insulin and/or oral diabetic medications. Patient states that he is willing to take insulin injections here in the hospital but he is not willing to take insulin after he is discharged from the hospital. Patient states that he does not want to take any "pills for diabetes" either due to side effects of them and states "All pills are designed to help one thing but they are destroying something else."  Explained importance of getting diabetes controlled especially for wound healing and to decrease risk of infection. Stressed the damage that occurs due to hyperglycemia and asked that the patient review Diabetes Medications booklet and discuss diabetes treatment options with the doctor. Will order RD consult, Living Well with Diabetes booklet, and DM education videos. Patient states that he is willing to start checking his blood glucose. Since patient has no insurance and will be paying out of pocket for glucometer informed him about the Reli-On glucometer at [facility] which can be purchased for $17 and a box of 50 test strips are $9.  Patient verbalized understanding of information discussed and he states that he has no further questions at  this time related to diabetes.    Thanks, Orlando Penner, RN, MSN, CCRN Diabetes Coordinator Inpatient Diabetes Program [phone] (Team Pager) [phone] (AP office) [phone] [facility] office)

## 2013-08-11 NOTE — Progress Notes (Signed)
Resting quietly in bed, no acute distress. Asleep at this time, has requested pain meds with regularity. Threatened to exit building today to smoke;explained to pt that leaving facility is not approved or advised.  notified Arlana Pouchate of pt intentions, nicotine patch ordered pt remained in room. Has ambulated in hall with no difficulty. Independent with transfers and selfcare.

## 2013-08-11 NOTE — Progress Notes (Signed)
Talked to patient about DCP; [place] in [place] and [place] in [place]; patient stated that he will be here for 2 more weeks then he is moving on to a new job elsewhere; CM informed patient that he might need time off from work to heal, patient stated that if I cant make any money, I cant pay my bills; Patient does not have a PCP/ no insurance; patient stated " [place] nothing in regards to healthcare".  Very challenging situation - pt's home is [place] but travels a lot with his job, no insurance, patient needs to be connected to the health care system for follow up wound care and Diabetes management but the patient's main focus is working and making money; CM will attempt to talk to patient again with spouse present; Abelino DerrickB Caidynce Muzyka RN,BSN,MHA [phone]

## 2013-08-11 NOTE — Op Note (Signed)
[name]:  Kevin Woodward              ACCOUNT NO.:  [id number]  MEDICAL RECORD NO.:  [id number]  LOCATION:  [id number]                        FACILITY:  [facility]  PHYSICIAN:  Madelynn DoneFred W Lamonte Hartt IV, MD  DATE OF BIRTH:  May 17, 1970  DATE OF PROCEDURE:  08/10/2013 DATE OF DISCHARGE:  08/09/2013                              OPERATIVE REPORT   PREOPERATIVE DIAGNOSES: 1. Right hand multiple abscesses. 2. Right long finger distal tip gangrene and distal phalangeal     osteomyelitis.  POSTOPERATIVE DIAGNOSES: 1. Right hand multiple abscesses. 2. Right long finger distal tip gangrene and distal phalangeal     osteomyelitis.  INDICATIONS:  Mr. Kevin Woodward is a 43 year old gentleman who presented to the ER yesterday.  He had a 3751-month history of persistent draining wound to the right long finger, presented to the ER.  I was told to stay in the emergency department for surgery yesterday afternoon, he left, he then returned, we then scheduled him again, he left again, he went to another hospital and was admitted to the Internal Medicine Service for management of the new onset diabetes and scheduled for surgery today. The patient had a severe infection to the right long finger and multiple abscesses in the hand and was recommended to undergo the above procedure.  Risks, benefits, and alternatives were discussed in detail with the patient and signed informed consent was obtained.  PROCEDURE: 1. Right long finger amputation to the level of the middle phalanx     with local neurectomies and advancement flap closure. 2. Right hand incision and drainage, deep abscess dorsum of the right     index finger. 3. Right hand incision and drainage of deep abscess of right hand     palmar surface over the carpal canal. 4. Right small finger drainage of subcutaneous abscess. 5. Right ring finger drainage of subcutaneous abscess.  DESCRIPTION OF PROCEDURE:  The patient was properly identified in the preop holding area and  marked with a permanent marker made on the right hand to indicate the correct operative site.  The patient was then brought back to the operating room, placed supine on the anesthesia table.  General anesthesia was administered.  The patient tolerated this well.  A well-padded tourniquet was placed on left brachium and sealed with 1000-drape.  Right upper extremity was then prepped and draped in normal sterile fashion.  Time-out was called, correct site was identified, and procedure then begun.  Attention turned to the right hand.  The 2 abscesses over the index finger and volar aspect of the transverse carpal ligament region were then opened up.  Gross purulence was encountered.  Deep abscesses extending all the way down to the level of the fascia.  The wounds were then thoroughly irrigated.  Wound cultures were then taken, both separate abscesses.  Following this, attention was then turned to the right long finger where the patient did have the open exposed distal phalanx.  The bone was then debrided back and there was severe unhealthy bone and osteomyelitis of the distal phalanx.  The patient had gangrenous tissue surrounding the finger tips, therefore after extensive bony debridement, it was decided to complete the amputation up to the level of  the distal phalanx.  Local neurectomies then carried out in order to obtain some wound closure. The skin flaps were then advanced dorsally and volarly.  The wound was then thoroughly irrigated and loosely reapproximated 3-0 Prolene sutures.  The patient also had small abscess of the small finger, the volar aspect and the ulnar aspect of the ring finger.  These 2 small stab incisions were then made over both fingers and these wounds were then thoroughly irrigated.  Bone was then sent for culture.  The open wounds over the index palmar surface ring and small were left open.  The wounds were irrigated.  Adaptic dressings were then applied.   Sterile compressive bandage was then applied.  The patient tolerated the procedure well and returned to recovery room in good condition.  POSTPROCEDURAL PLAN:  The patient admitted back to the Internal Medicine Service.  Await culture, he is not recommended to leave the hospital until his wound cultures come back.  Treat the antibiotics appropriately and see him back next week, Monday, in the hospital.  Look at his wound and then begin try to get him into an outpatient therapy, wound care regimen.  Follow him closely.  The patient may need repeat I and D depending on how it response.  Prognosis, if the patient's blood sugars get under control, we get him in the wound care management.  I think he should heal these wounds. Again, the patient had an unfortunate condition of the long finger with chronic draining osteomyelitic finger tip that required amputation.     Madelynn Done, MD     FWO/MEDQ  D:  08/10/2013  T:  08/11/2013  Job:  [id number]

## 2013-08-11 NOTE — Progress Notes (Signed)
ANTIBIOTIC CONSULT NOTE - INITIAL  Pharmacy Consult for vancomycin Indication: R finger osteo; R hand abscesses  No Known Allergies  Patient Measurements: Height: 5\' 9"  (175.3 cm) Weight: 228 lb 9.6 oz (103.692 kg) IBW/kg (Calculated) : 70.7  Vital Signs: Temp: 98.6 F (37 C) (07/10 1007) Temp src: Oral (07/10 1007) BP: 130/82 mmHg (07/10 1007) Pulse Rate: 88 (07/10 1007) Intake/Output from previous day: 07/09 0701 - 07/10 0700 In: 1300 [I.V.:1300] Out: -  Intake/Output from this shift: Total I/O In: 260 [P.O.:260] Out: -   Labs:  Recent Labs  08/09/13 1230 08/10/13 0940 08/11/13 0722  WBC 18.4* 13.7* 15.3*  HGB 14.4 12.4* 12.4*  PLT 432* 371 382  CREATININE 0.54 0.48* 0.65   Estimated Creatinine Clearance: 142.7 ml/min (by C-G formula based on Cr of 0.65). No results found for this basename: VANCOTROUGH, VANCOPEAK, VANCORANDOM, GENTTROUGH, GENTPEAK, GENTRANDOM, TOBRATROUGH, TOBRAPEAK, TOBRARND, AMIKACINPEAK, AMIKACINTROU, AMIKACIN,  in the last 72 hours   Microbiology: Recent Results (from the past 720 hour(s))  WOUND CULTURE     Status: None   Collection Time    08/10/13  6:55 PM      Result Value Ref Range Status   Specimen Description WOUND HAND RIGHT   Final   Special Requests NONE   Final   Gram Stain     Final   Value: RARE WBC PRESENT,BOTH PMN AND MONONUCLEAR     NO SQUAMOUS EPITHELIAL CELLS SEEN     NO ORGANISMS SEEN     [facility]formed [facility]   Culture     Final   Value: NO GROWTH     [facility]formed at [facility]   Report Status PENDING   Incomplete  ANAEROBIC CULTURE     Status: None   Collection Time    08/10/13  6:55 PM      Result Value Ref Range Status   Specimen Description WOUND HAND RIGHT   Final   Special Requests NONE   Final   Gram Stain     Final   Value: RARE WBC PRESENT,BOTH PMN AND MONONUCLEAR     NO SQUAMOUS EPITHELIAL CELLS SEEN     NO ORGANISMS SEEN     [facility]formed [facility]   Culture      Final   Value: NO ANAEROBES ISOLATED; CULTURE IN PROGRESS FOR 5 DAYS     Performed at [facility]   Report Status PENDING   Incomplete  TISSUE CULTURE     Status: None   Collection Time    08/10/13  6:55 PM      Result Value Ref Range Status   Specimen Description TISSUE FINGER RIGHT   Final   Special Requests NONE   Final   Gram Stain     Final   Value: ABUNDANT WBC PRESENT, PREDOMINANTLY PMN     FEW GRAM POSITIVE COCCI     IN PAIRS IN CLUSTERS     Performed at [facility]   Culture     Final   Value: NO GROWTH     Performed at [facility]   Report Status PENDING   Incomplete  ANAEROBIC CULTURE     Status: None   Collection Time    08/10/13  6:55 PM      Result Value Ref Range Status   Specimen Description TISSUE FINGER RIGHT   Final   Special Requests NONE   Final   Gram Stain     Final  Value: ABUNDANT WBC PRESENT,BOTH PMN AND MONONUCLEAR     FEW GRAM POSITIVE COCCI     IN PAIRS IN CLUSTERS     Performed at [facility]   Culture     Final   Value: NO ANAEROBES ISOLATED; CULTURE IN PROGRESS FOR 5 DAYS     Performed at [facility]   Report Status PENDING   Incomplete  WOUND CULTURE     Status: None   Collection Time    08/10/13  6:56 PM      Result Value Ref Range Status   Specimen Description WOUND FINGER RIGHT   Final   Special Requests INDEX FINGER TIP   Final   Gram Stain     Final   Value: RARE WBC PRESENT, PREDOMINANTLY PMN     NO SQUAMOUS EPITHELIAL CELLS SEEN     FEW GRAM POSITIVE COCCI     IN PAIRS     Performed at [facility]   Culture     Final   Value: Culture reincubated for better growth     Performed at [facility]   Report Status PENDING   Incomplete  ANAEROBIC CULTURE     Status: None   Collection Time    08/10/13  6:56 PM      Result Value Ref Range Status   Specimen Description WOUND FINGER RIGHT   Final   Special Requests INDEX FINGER TIP   Final   Gram Stain     Final    Value: RARE WBC PRESENT, PREDOMINANTLY PMN     NO SQUAMOUS EPITHELIAL CELLS SEEN     FEW GRAM POSITIVE COCCI     IN PAIRS     Performed at [facility]   Culture     Final   Value: NO ANAEROBES ISOLATED; CULTURE IN PROGRESS FOR 5 DAYS     Performed at [facility]   Report Status PENDING   Incomplete  ANAEROBIC CULTURE     Status: None   Collection Time    08/10/13  6:58 PM      Result Value Ref Range Status   Specimen Description TISSUE BONE FINGER RIGHT   Final   Special Requests NONE   Final   Gram Stain     Final   Value: NO WBC SEEN     FEW GRAM POSITIVE COCCI     IN PAIRS     Performed at [facility]   Culture     Final   Value: NO ANAEROBES ISOLATED; CULTURE IN PROGRESS FOR 5 DAYS     Performed at [facility]   Report Status PENDING   Incomplete  TISSUE CULTURE     Status: None   Collection Time    08/10/13  6:58 PM      Result Value Ref Range Status   Specimen Description TISSUE BONE FINGER RIGHT   Final   Special Requests NONE   Final   Gram Stain     Final   Value: NO WBC SEEN     FEW GRAM POSITIVE COCCI     IN PAIRS     Performed at [facility]   Culture     Final   Value: Culture reincubated for better growth     Performed at [facility]   Report Status PENDING   Incomplete    Medical History: Past Medical History  Diagnosis Date  . DM2 (diabetes mellitus, type 2)  initial daignosis 12 years ago, lost weight and DM has been controlled with diet until now    Medications:  Prescriptions prior to admission  Medication Sig Dispense Refill  . Aspirin-Acetaminophen 500-325 MG PACK Take 2 Packages by mouth every 4 (four) hours as needed (pain).      . Naproxen Sodium (ALEVE PO) Take 2 tablets by mouth daily as needed (pain).      . Neomycin-Bacitracin-Polymyxin (NEOSPORIN EX) Apply 1 application topically daily as needed (wound on finger).       Assessment: 43 y/o male who presented to the ED with pain  and swelling of R middle finger for 1 month. He attempted to lance the wound himself with a razor blade PTA. He is no s/p I&D  And was diagnosed with R middle finger osteo. He also has a new diagnosis of DM. Pharmacy consulted to begin vancomycin. Renal function is normal. WBC are elevated and he is febrile. Patient had vancomycin 1500 mg IV x1 dose last night. He has been on Zosyn since 7/8.  Vanc 7/9>> Zosyn 7/8>>  7/9 R hand wound - ngtd 7/9 R finger tissue x2 - few GPC 7/9 R finger wound - few GPC  Goal of Therapy:  Vancomycin trough level 15-20 mcg/ml  Plan:  - Vancomycin 2000 mg IV once then 1000 mg IV q12h - Monitor renal function, clinical course and culture data - Trough at steady state as clinically indicated  [facility], [place].D., BCPS Clinical Pharmacist Pager: [phone] 08/11/2013 2:04 PM

## 2013-08-12 ENCOUNTER — Inpatient Hospital Stay (HOSPITAL_COMMUNITY): Payer: Self-pay

## 2013-08-12 LAB — CBC
HEMATOCRIT: 37.6 % — AB (ref 39.0–52.0)
Hemoglobin: 12.4 g/dL — ABNORMAL LOW (ref 13.0–17.0)
MCH: 27.6 pg (ref 26.0–34.0)
MCHC: 33 g/dL (ref 30.0–36.0)
MCV: 83.7 fL (ref 78.0–100.0)
Platelets: 402 10*3/uL — ABNORMAL HIGH (ref 150–400)
RBC: 4.49 MIL/uL (ref 4.22–5.81)
RDW: 13.1 % (ref 11.5–15.5)
WBC: 12.5 10*3/uL — ABNORMAL HIGH (ref 4.0–10.5)

## 2013-08-12 LAB — BASIC METABOLIC PANEL
ANION GAP: 13 (ref 5–15)
BUN: 11 mg/dL (ref 6–23)
CALCIUM: 9.1 mg/dL (ref 8.4–10.5)
CHLORIDE: 98 meq/L (ref 96–112)
CO2: 26 meq/L (ref 19–32)
Creatinine, Ser: 0.67 mg/dL (ref 0.50–1.35)
GFR calc Af Amer: >90 mL/min (ref >90)
GFR calc non Af Amer: >90 mL/min (ref >90)
Glucose, Bld: 297 mg/dL — ABNORMAL HIGH (ref 70–99)
POTASSIUM: 4.2 meq/L (ref 3.7–5.3)
Sodium: 137 mEq/L (ref 137–147)

## 2013-08-12 LAB — GLUCOSE, CAPILLARY
Glucose-Capillary: 278 mg/dL — ABNORMAL HIGH (ref 70–99)
Glucose-Capillary: 257 mg/dL — ABNORMAL HIGH (ref 70–99)
Glucose-Capillary: 306 mg/dL — ABNORMAL HIGH (ref 70–99)
Glucose-Capillary: 191 mg/dL — ABNORMAL HIGH (ref 70–99)

## 2013-08-12 MED ORDER — SENNA 8.6 MG PO TABS
2.0000 | ORAL_TABLET | Freq: Every day | ORAL | Status: DC
  Administered 2013-08-12 – 2013-08-15 (×4): 17.2 mg via ORAL
  Filled 2013-08-12 (×3): qty 2

## 2013-08-12 MED ORDER — DOCUSATE SODIUM 100 MG PO CAPS
100.0000 mg | ORAL_CAPSULE | Freq: Two times a day (BID) | ORAL | Status: DC
  Administered 2013-08-12 – 2013-08-15 (×5): 100 mg via ORAL
  Filled 2013-08-12 (×5): qty 1

## 2013-08-12 MED ORDER — CYCLOBENZAPRINE HCL 10 MG PO TABS
5.0000 mg | ORAL_TABLET | Freq: Three times a day (TID) | ORAL | Status: DC
  Administered 2013-08-12: 5 mg via ORAL
  Administered 2013-08-12: 22:00:00 via ORAL
  Administered 2013-08-13 – 2013-08-14 (×5): 5 mg via ORAL
  Filled 2013-08-12 (×7): qty 1

## 2013-08-12 NOTE — Progress Notes (Signed)
PROGRESS NOTE  Kevin Woodward WUJ:[id number] DOB: 04-13-1970 DOA: 08/09/2013 PCP: No PCP Per Patient  Assessment/Plan: Sepsis  -Present at the time of admission with tachycardia and leukocytosis  -Secondary to hand infection  Osteomyelitis of Phalanx  -add IV vanco pending culture data  -discussed intraoperative findings with Dr. Melvyn Novasrtmann  -s/p I&D 08/10/13-->follow cultures-->S.aureus and GBS  Cellulitis/abscesss hand and digits  -d/c zosyn  -continue vanco  Diabetes mellitus, type II, uncontrolled  -Hemoglobin A1c 13.5  -switch to 70/30 insulin due to pt financial situation and cost of Lantus  -NovoLog sliding scale  -Patient now agrees to taking insulin  -Diabetic coordinator has provided education  PseudoHyponatremia  -improved with IVF and tx of DM  - Secondary to hyperglycemia  -IV fluids  Rib pain -rib series -start flexeril Tobacco abuse  -Tobacco cessation discussed  -nicoderm patch  Constipation -colace and Senna Family Communication: wife at beside  Disposition Plan: Home when medically stable  Antibiotics:  Zosyn 08/09/13>>> 08/12/13 Vanco 08/11/13>>>         Procedures/Studies: Dg Finger Middle Right  08/09/2013   CLINICAL DATA:  HAND PAIN  EXAM: RIGHT MIDDLE FINGER 2+V  COMPARISON:  None.  FINDINGS: There is no evidence of fracture or dislocation. The cortical margins appear intact. There is no evidence of arthropathy or other focal bone abnormality. A soft tissue ulceration along the distal tip of the third digit.  IMPRESSION: Soft tissue ulceration overlying the distal tuft of the third digit. There is no evidence of cortical destruction nor acute osseous abnormalities.   Electronically Signed   By: Salome HolmesHector  Cooper M.D.   On: 08/09/2013 13:42         Subjective: Pt c/o right lower rib pain. Denies fevers, chills, chest pain, shortness breath, nausea, vomiting, diarrhea, dysuria, rashes. No headaches or dizziness.  Objective: Filed Vitals:    08/11/13 2133 08/12/13 0518 08/12/13 0930 08/12/13 1400  BP: 163/87 160/80 134/77 125/61  Pulse: 84 84 89 98  Temp: 98.1 F (36.7 C) 98.5 F (36.9 C) 98.5 F (36.9 C) 98.1 F (36.7 C)  TempSrc: Oral Oral Oral Axillary  Resp: 20 20 20 20   Height:      Weight:      SpO2: 95% 96% 91% 93%   No intake or output data in the 24 hours ending 08/12/13 1707 Weight change:  Exam:   General:  Pt is alert, follows commands appropriately, not in acute distress  HEENT: No icterus, No thrush,  [place]/AT  Cardiovascular: RRR, S1/S2, no rubs, no gallops  Respiratory: CTA bilaterally, no wheezing, no crackles, no rhonchi  Abdomen: Soft/+BS, non tender, non distended, no guarding  Extremities: No edema, No lymphangitis, No petechiae, No rashes, no synovitis; right 10th rib pain and discomfort with paraspinal hypertonicity. No rashes visible. Right upper extremity in bulky dressing  Data Reviewed: Basic Metabolic Panel:  Recent Labs Lab 08/09/13 1230 08/10/13 0940 08/11/13 0722 08/12/13 0504  NA 128* 139 135* 137  K 4.4 3.8 3.9 4.2  CL 90* 103 97 98  CO2 20 23 24 26   GLUCOSE 525* 236* 233* 297*  BUN 13 11 11 11   CREATININE 0.54 0.48* 0.65 0.67  CALCIUM 9.5 7.9* 8.3* 9.1   Liver Function Tests:  Recent Labs Lab 08/09/13 1230  AST 12  ALT 20  ALKPHOS 115  BILITOT <0.2*  PROT 7.6  ALBUMIN 3.4*   No results found for this basename: LIPASE, AMYLASE,  in  the last 168 hours No results found for this basename: AMMONIA,  in the last 168 hours CBC:  Recent Labs Lab 08/09/13 1230 08/10/13 0940 08/11/13 0722 08/12/13 0504  WBC 18.4* 13.7* 15.3* 12.5*  NEUTROABS 14.2*  --   --   --   HGB 14.4 12.4* 12.4* 12.4*  HCT 41.5 38.0* 38.6* 37.6*  MCV 82.0 85.0 85.8 83.7  PLT 432* 371 382 402*   Cardiac Enzymes: No results found for this basename: CKTOTAL, CKMB, CKMBINDEX, TROPONINI,  in the last 168 hours BNP: No components found with this basename: POCBNP,  CBG:  Recent  Labs Lab 08/11/13 1157 08/11/13 1651 08/11/13 2056 08/12/13 0645 08/12/13 1210  GLUCAP 229* 282* 257* 278* 257*    Recent Results (from the past 240 hour(s))  WOUND CULTURE     Status: None   Collection Time    08/10/13  6:55 PM      Result Value Ref Range Status   Specimen Description WOUND HAND RIGHT   Final   Special Requests NONE   Final   Gram Stain     Final   Value: RARE WBC PRESENT,BOTH PMN AND MONONUCLEAR     NO SQUAMOUS EPITHELIAL CELLS SEEN     NO ORGANISMS SEEN     Performed at [facility]   Culture     Final   Value: FEW STAPHYLOCOCCUS AUREUS     Note: RIFAMPIN AND GENTAMICIN SHOULD NOT BE USED AS SINGLE DRUGS FOR TREATMENT OF STAPH INFECTIONS.     Performed at [facility]   Report Status PENDING   Incomplete  ANAEROBIC CULTURE     Status: None   Collection Time    08/10/13  6:55 PM      Result Value Ref Range Status   Specimen Description WOUND HAND RIGHT   Final   Special Requests NONE   Final   Gram Stain     Final   Value: RARE WBC PRESENT,BOTH PMN AND MONONUCLEAR     NO SQUAMOUS EPITHELIAL CELLS SEEN     NO ORGANISMS SEEN     Performed at [facility]   Culture     Final   Value: NO ANAEROBES ISOLATED; CULTURE IN PROGRESS FOR 5 DAYS     Performed at [facility]   Report Status PENDING   Incomplete  TISSUE CULTURE     Status: None   Collection Time    08/10/13  6:55 PM      Result Value Ref Range Status   Specimen Description TISSUE FINGER RIGHT   Final   Special Requests NONE   Final   Gram Stain     Final   Value: ABUNDANT WBC PRESENT, PREDOMINANTLY PMN     FEW GRAM POSITIVE COCCI     IN PAIRS IN CLUSTERS     Performed at [facility]   Culture     Final   Value: ABUNDANT STAPHYLOCOCCUS AUREUS     Note: RIFAMPIN AND GENTAMICIN SHOULD NOT BE USED AS SINGLE DRUGS FOR TREATMENT OF STAPH INFECTIONS.     Performed at [facility]   Report Status PENDING   Incomplete  ANAEROBIC CULTURE      Status: None   Collection Time    08/10/13  6:55 PM      Result Value Ref Range Status   Specimen Description TISSUE FINGER RIGHT   Final   Special Requests NONE   Final   Gram Stain  Final   Value: ABUNDANT WBC PRESENT,BOTH PMN AND MONONUCLEAR     FEW GRAM POSITIVE COCCI     IN PAIRS IN CLUSTERS     Performed at [facility]   Culture     Final   Value: NO ANAEROBES ISOLATED; CULTURE IN PROGRESS FOR 5 DAYS     Performed at [facility]   Report Status PENDING   Incomplete  WOUND CULTURE     Status: None   Collection Time    08/10/13  6:56 PM      Result Value Ref Range Status   Specimen Description WOUND FINGER RIGHT   Final   Special Requests INDEX FINGER TIP   Final   Gram Stain     Final   Value: RARE WBC PRESENT, PREDOMINANTLY PMN     NO SQUAMOUS EPITHELIAL CELLS SEEN     FEW GRAM POSITIVE COCCI     IN PAIRS     Performed at [facility]   Culture     Final   Value: ABUNDANT STAPHYLOCOCCUS AUREUS     Performed at [facility]   Report Status PENDING   Incomplete  ANAEROBIC CULTURE     Status: None   Collection Time    08/10/13  6:56 PM      Result Value Ref Range Status   Specimen Description WOUND FINGER RIGHT   Final   Special Requests INDEX FINGER TIP   Final   Gram Stain     Final   Value: RARE WBC PRESENT, PREDOMINANTLY PMN     NO SQUAMOUS EPITHELIAL CELLS SEEN     FEW GRAM POSITIVE COCCI     IN PAIRS     Performed at [facility]   Culture     Final   Value: NO ANAEROBES ISOLATED; CULTURE IN PROGRESS FOR 5 DAYS     Performed at [facility]   Report Status PENDING   Incomplete  ANAEROBIC CULTURE     Status: None   Collection Time    08/10/13  6:58 PM      Result Value Ref Range Status   Specimen Description TISSUE BONE FINGER RIGHT   Final   Special Requests NONE   Final   Gram Stain     Final   Value: NO WBC SEEN     FEW GRAM POSITIVE COCCI     IN PAIRS     Performed at [facility]    Culture     Final   Value: NO ANAEROBES ISOLATED; CULTURE IN PROGRESS FOR 5 DAYS     Performed at [facility]   Report Status PENDING   Incomplete  TISSUE CULTURE     Status: None   Collection Time    08/10/13  6:58 PM      Result Value Ref Range Status   Specimen Description TISSUE BONE FINGER RIGHT   Final   Special Requests NONE   Final   Gram Stain     Final   Value: NO WBC SEEN     FEW GRAM POSITIVE COCCI     IN PAIRS     Performed at [facility]   Culture     Final   Value: ABUNDANT STAPHYLOCOCCUS AUREUS     ABUNDANT GROUP B STREP(S.AGALACTIAE)ISOLATED     Note: TESTING AGAINST S. AGALACTIAE NOT ROUTINELY PERFORMED DUE TO PREDICTABILITY OF AMP/PEN/VAN SUSCEPTIBILITY.     Performed at [facility]  Report Status PENDING   Incomplete     Scheduled Meds: . cyclobenzaprine  5 mg Oral TID  . docusate sodium  100 mg Oral BID  . insulin aspart  0-15 Units Subcutaneous TID WC  . insulin aspart  0-5 Units Subcutaneous QHS  . insulin aspart protamine- aspart  20 Units Subcutaneous BID WC  . nicotine  21 mg Transdermal Daily  . piperacillin-tazobactam (ZOSYN)  IV  3.375 g Intravenous 3 times per day  . senna  2 tablet Oral Daily  . vancomycin  1,000 mg Intravenous Q12H   Continuous Infusions: . sodium chloride 125 mL/hr at 08/12/13 1400  . lactated ringers 50 mL/hr at 08/10/13 1805     Keeley Sussman, DO  [facility] Pager [phone]  If 7PM-7AM, please contact night-coverage [email or web address] Password [email or web address] 08/12/2013, 5:07 PM   LOS: 3 days

## 2013-08-13 ENCOUNTER — Encounter (HOSPITAL_COMMUNITY): Payer: Self-pay | Admitting: *Deleted

## 2013-08-13 DIAGNOSIS — A4102 Sepsis due to Methicillin resistant Staphylococcus aureus: Principal | ICD-10-CM

## 2013-08-13 LAB — GLUCOSE, CAPILLARY
Glucose-Capillary: 303 mg/dL — ABNORMAL HIGH (ref 70–99)
Glucose-Capillary: 186 mg/dL — ABNORMAL HIGH (ref 70–99)
Glucose-Capillary: 231 mg/dL — ABNORMAL HIGH (ref 70–99)
Glucose-Capillary: 146 mg/dL — ABNORMAL HIGH (ref 70–99)

## 2013-08-13 LAB — CBC
HCT: 34.7 % — ABNORMAL LOW (ref 39.0–52.0)
HEMOGLOBIN: 11.4 g/dL — AB (ref 13.0–17.0)
MCH: 27.5 pg (ref 26.0–34.0)
MCHC: 32.9 g/dL (ref 30.0–36.0)
MCV: 83.6 fL (ref 78.0–100.0)
Platelets: 393 10*3/uL (ref 150–400)
RBC: 4.15 MIL/uL — ABNORMAL LOW (ref 4.22–5.81)
RDW: 13.2 % (ref 11.5–15.5)
WBC: 11.8 10*3/uL — ABNORMAL HIGH (ref 4.0–10.5)

## 2013-08-13 LAB — TISSUE CULTURE

## 2013-08-13 LAB — WOUND CULTURE

## 2013-08-13 LAB — VANCOMYCIN, TROUGH: Vancomycin Tr: <5 ug/mL — ABNORMAL LOW (ref 10.0–20.0)

## 2013-08-13 MED ORDER — VANCOMYCIN HCL IN DEXTROSE 1-5 GM/200ML-% IV SOLN
1000.0000 mg | Freq: Three times a day (TID) | INTRAVENOUS | Status: DC
  Administered 2013-08-13 – 2013-08-15 (×5): 1000 mg via INTRAVENOUS
  Filled 2013-08-13 (×7): qty 200

## 2013-08-13 MED ORDER — INSULIN ASPART PROT & ASPART (70-30 MIX) 100 UNIT/ML ~~LOC~~ SUSP
30.0000 [IU] | Freq: Two times a day (BID) | SUBCUTANEOUS | Status: DC
  Administered 2013-08-13 – 2013-08-14 (×2): 30 [IU] via SUBCUTANEOUS

## 2013-08-13 MED ORDER — BISACODYL 10 MG RE SUPP: 10.0000 mg | Freq: Once | RECTAL | Status: DC

## 2013-08-13 MED ORDER — ENOXAPARIN SODIUM 40 MG/0.4ML ~~LOC~~ SOLN
40.0000 mg | SUBCUTANEOUS | Status: DC
  Administered 2013-08-13: 40 mg via SUBCUTANEOUS
  Filled 2013-08-13: qty 0.4

## 2013-08-13 NOTE — Progress Notes (Signed)
ANTIBIOTIC CONSULT NOTE - INITIAL  Pharmacy Consult for vancomycin Indication: R finger osteo; R hand abscesses  No Known Allergies  Patient Measurements: Height: 5\' 9"  (175.3 cm) Weight: 228 lb 9.6 oz (103.692 kg) IBW/kg (Calculated) : 70.7  Vital Signs: Temp: 97.8 F (36.6 C) (07/12 1419) Temp src: Oral (07/12 1419) BP: 140/80 mmHg (07/12 1419) Pulse Rate: 86 (07/12 1419) Intake/Output from previous day:   Intake/Output from this shift:    Labs:  Recent Labs  08/11/13 0722 08/12/13 0504 08/13/13 0735  WBC 15.3* 12.5* 11.8*  HGB 12.4* 12.4* 11.4*  PLT 382 402* 393  CREATININE 0.65 0.67  --    Estimated Creatinine Clearance: 142.7 ml/min (by C-G formula based on Cr of 0.67).  Recent Labs  08/13/13 1425  VANCOTROUGH <5.0*     Microbiology: Recent Results (from the past 720 hour(s))  WOUND CULTURE     Status: None   Collection Time    08/10/13  6:55 PM      Result Value Ref Range Status   Specimen Description WOUND HAND RIGHT   Final   Special Requests NONE   Final   Gram Stain     Final   Value: RARE WBC PRESENT,BOTH PMN AND MONONUCLEAR     NO SQUAMOUS EPITHELIAL CELLS SEEN     NO ORGANISMS SEEN     Performed at [facility]   Culture     Final   Value: FEW METHICILLIN RESISTANT STAPHYLOCOCCUS AUREUS     Note: RIFAMPIN AND GENTAMICIN SHOULD NOT BE USED AS SINGLE DRUGS FOR TREATMENT OF STAPH INFECTIONS. This organism DOES NOT demonstrate inducible Clindamycin resistance in vitro. CRITICAL RESULT CALLED TO, READ BACK BY AND VERIFIED WITH: MARISSA E 7/12      @1040  BY REAMM     Performed at [facility]   Report Status 08/13/2013 FINAL   Final   Organism ID, Bacteria METHICILLIN RESISTANT STAPHYLOCOCCUS AUREUS   Final  ANAEROBIC CULTURE     Status: None   Collection Time    08/10/13  6:55 PM      Result Value Ref Range Status   Specimen Description WOUND HAND RIGHT   Final   Special Requests NONE   Final   Gram Stain     Final   Value:  RARE WBC PRESENT,BOTH PMN AND MONONUCLEAR     NO SQUAMOUS EPITHELIAL CELLS SEEN     NO ORGANISMS SEEN     Performed at [facility]   Culture     Final   Value: NO ANAEROBES ISOLATED; CULTURE IN PROGRESS FOR 5 DAYS     Performed at [facility]   Report Status PENDING   Incomplete  TISSUE CULTURE     Status: None   Collection Time    08/10/13  6:55 PM      Result Value Ref Range Status   Specimen Description TISSUE FINGER RIGHT   Final   Special Requests NONE   Final   Gram Stain     Final   Value: ABUNDANT WBC PRESENT, PREDOMINANTLY PMN     FEW GRAM POSITIVE COCCI     IN PAIRS IN CLUSTERS     Performed at [facility]   Culture     Final   Value: ABUNDANT METHICILLIN RESISTANT STAPHYLOCOCCUS AUREUS     Note: RIFAMPIN AND GENTAMICIN SHOULD NOT BE USED AS SINGLE DRUGS FOR TREATMENT OF STAPH INFECTIONS. This organism DOES NOT demonstrate inducible Clindamycin resistance in vitro.  CRITICAL RESULT CALLED TO, READ BACK BY AND VERIFIED WITH: MARISSA E 7/12      @1040  BY REAM     [facility]formed at [facility]   Report Status 08/13/2013 FINAL   Final   Organism ID, Bacteria METHICILLIN RESISTANT STAPHYLOCOCCUS AUREUS   Final  ANAEROBIC CULTURE     Status: None   Collection Time    08/10/13  6:55 PM      Result Value Ref Range Status   Specimen Description TISSUE FINGER RIGHT   Final   Special Requests NONE   Final   Gram Stain     Final   Value: ABUNDANT WBC PRESENT,BOTH PMN AND MONONUCLEAR     FEW GRAM POSITIVE COCCI     IN PAIRS IN CLUSTERS     [facility]formed [facility]   Culture     Final   Value: NO ANAEROBES ISOLATED; CULTURE IN PROGRESS FOR 5 DAYS     [facility]formed at [facility]   Report Status PENDING   Incomplete  WOUND CULTURE     Status: None   Collection Time    08/10/13  6:56 PM      Result Value Ref Range Status   Specimen Description WOUND FINGER RIGHT   Final   Special Requests INDEX FINGER TIP   Final   Gram Stain      Final   Value: RARE WBC PRESENT, PREDOMINANTLY PMN     NO SQUAMOUS EPITHELIAL CELLS SEEN     FEW GRAM POSITIVE COCCI     IN PAIRS     [facility]formed at [facility]   Culture     Final   Value: ABUNDANT STAPHYLOCOCCUS AUREUS     Performed at [facility]   Report Status PENDING   Incomplete  ANAEROBIC CULTURE     Status: None   Collection Time    08/10/13  6:56 PM      Result Value Ref Range Status   Specimen Description WOUND FINGER RIGHT   Final   Special Requests INDEX FINGER TIP   Final   Gram Stain     Final   Value: RARE WBC PRESENT, PREDOMINANTLY PMN     NO SQUAMOUS EPITHELIAL CELLS SEEN     FEW GRAM POSITIVE COCCI     IN PAIRS     [facility]formed [facility]   Culture     Final   Value: NO ANAEROBES ISOLATED; CULTURE IN PROGRESS FOR 5 DAYS     Performed at [facility]   Report Status PENDING   Incomplete  ANAEROBIC CULTURE     Status: None   Collection Time    08/10/13  6:58 PM      Result Value Ref Range Status   Specimen Description TISSUE BONE FINGER RIGHT   Final   Special Requests NONE   Final   Gram Stain     Final   Value: NO WBC SEEN     FEW GRAM POSITIVE COCCI     IN PAIRS     [facility]formed at [facility]   Culture     Final   Value: NO ANAEROBES ISOLATED; CULTURE IN PROGRESS FOR 5 DAYS     [facility]formed at [facility]   Report Status PENDING   Incomplete  TISSUE CULTURE     Status: None   Collection Time    08/10/13  6:58 PM      Result Value Ref Range Status  Specimen Description TISSUE BONE FINGER RIGHT   Final   Special Requests NONE   Final   Gram Stain     Final   Value: NO WBC SEEN     FEW GRAM POSITIVE COCCI     IN PAIRS     Performed at [facility]   Culture     Final   Value: ABUNDANT STAPHYLOCOCCUS AUREUS     ABUNDANT GROUP B STREP(S.AGALACTIAE)ISOLATED     Note: TESTING AGAINST S. AGALACTIAE NOT ROUTINELY PERFORMED DUE TO PREDICTABILITY OF AMP/PEN/VAN SUSCEPTIBILITY.     Performed at  [facility]   Report Status PENDING   Incomplete    Medical History: Past Medical History  Diagnosis Date  . DM2 (diabetes mellitus, type 2)     initial daignosis 12 years ago, lost weight and DM has been controlled with diet until now    Medications:  Prescriptions prior to admission  Medication Sig Dispense Refill  . Aspirin-Acetaminophen 500-325 MG PACK Take 2 Packages by mouth every 4 (four) hours as needed (pain).      . Naproxen Sodium (ALEVE PO) Take 2 tablets by mouth daily as needed (pain).      . Neomycin-Bacitracin-Polymyxin (NEOSPORIN EX) Apply 1 application topically daily as needed (wound on finger).       Assessment: 43 y/o on vancomycin for R finger osteomyelitis, s/p I&D 7/9. WBC are elevated and he is febrile. Vancomycin trough is < 5 mcg/ml, drawn appropriately. Renal function has been stable.   Vanc 7/9>>  Zosyn 7/8>>   7/9 R hand wound - ngtd  7/9 R finger tissue x2 - staph aureus 7/9 R finger wound - staph aureus  Goal of Therapy:  Vancomycin trough level 15-20 mcg/ml  Plan:  - Increase vancomycin to 1g IV Q 8 hrs - Monitor renal function, clinical course and culture data - Trough at steady state as clinically indicated  Bayard Hugger, PharmD, BCPS  Clinical Pharmacist  Pager: [phone]   08/13/2013 5:40 PM

## 2013-08-13 NOTE — Progress Notes (Signed)
PROGRESS NOTE  Kevin Woodward ZOX:[id number] DOB: 1970/12/13 DOA: 08/09/2013 PCP: No PCP Per Patient  Assessment/Plan: Sepsis  -Present at the time of admission with tachycardia and leukocytosis  -Secondary to hand infection  Osteomyelitis of Phalanx  -discussed intraoperative findings with Dr. Melvyn Novasrtmann  -s/p I&D 08/10/13-->follow cultures-->MRSA and GBS  Cellulitis/abscesss hand and digits  -d/c zosyn  -continue vanco -plan to d/c with Bactrim DS and amoxil Diabetes mellitus, type II, uncontrolled  -Hemoglobin A1c 13.5  -switch to 70/30 insulin due to pt financial situation and cost of Lantus  -increase 70/30 to 30 units bid -NovoLog sliding scale  -Patient now agrees to taking insulin  -Diabetic coordinator has provided education  PseudoHyponatremia  -improved with IVF and tx of DM  - Secondary to hyperglycemia  -IV fluids  Rib pain  -rib series--neg -start flexeril--some improvement Tobacco abuse  -Tobacco cessation discussed  -nicoderm patch deferred b/c it is causing bad dreams Constipation  -colace and Senna  -add bisacodyl Family Communication: wife at beside  Disposition Plan: Home when medically stable  Antibiotics:  Zosyn 08/09/13>>> 08/12/13  Vanco 08/11/13>>>           Procedures/Studies: Dg Ribs Unilateral W/chest Right  08/13/2013   CLINICAL DATA:  Right-sided rib pain.  No injury.  EXAM: RIGHT RIBS AND CHEST - 3+ VIEW  COMPARISON:  None.  FINDINGS: No fracture or other bone lesions are seen involving the ribs. There is no evidence of pneumothorax or pleural effusion. Both lungs are clear. Heart size and mediastinal contours are within normal limits.  IMPRESSION: Negative.   Electronically Signed   By: Burman NievesWilliam  Stevens M.D.   On: 08/13/2013 03:41   Dg Finger Middle Right  08/09/2013   CLINICAL DATA:  HAND PAIN  EXAM: RIGHT MIDDLE FINGER 2+V  COMPARISON:  None.  FINDINGS: There is no evidence of fracture or dislocation. The cortical margins  appear intact. There is no evidence of arthropathy or other focal bone abnormality. A soft tissue ulceration along the distal tip of the third digit.  IMPRESSION: Soft tissue ulceration overlying the distal tuft of the third digit. There is no evidence of cortical destruction nor acute osseous abnormalities.   Electronically Signed   By: Salome HolmesHector  Cooper M.D.   On: 08/09/2013 13:42         Subjective: He continues to complain of some pain in the right flank rib area. Denies any fevers, chills, chest pain, shortness breath, nausea, vomiting, diarrhea, abdominal pain, dysuria, hematuria. No rashes. Good air movement.  Objective: Filed Vitals:   08/12/13 1400 08/12/13 2134 08/13/13 0507 08/13/13 1419  BP: 125/61 146/82 129/86 140/80  Pulse: 98 89 91 86  Temp: 98.1 F (36.7 C) 99.7 F (37.6 C) 98.8 F (37.1 C) 97.8 F (36.6 C)  TempSrc: Axillary Oral Oral Oral  Resp: 20 20 20 18   Height:      Weight:      SpO2: 93% 96% 96% 98%    Intake/Output Summary (Last 24 hours) at 08/13/13 1843 Last data filed at 08/13/13 0401  Gross per 24 hour  Intake      0 ml  Output      0 ml  Net      0 ml   Weight change:  Exam:   General:  Pt is alert, follows commands appropriately, not in acute distress  HEENT: No icterus, No thrush,[place]/AT  Cardiovascular: RRR, S1/S2, no rubs, no gallops  Respiratory:  CTA bilaterally, no wheezing, no crackles, no rhonchi  Abdomen: Soft/+BS, non tender, non distended, no guarding  Extremities: Right hand in a bulky dressing. Right flank and rhythm without any erythema, drainage. No crepitance. There is tenderness to palpation along the posterior right ribs.  Data Reviewed: Basic Metabolic Panel:  Recent Labs Lab 08/09/13 1230 08/10/13 0940 08/11/13 0722 08/12/13 0504  NA 128* 139 135* 137  K 4.4 3.8 3.9 4.2  CL 90* 103 97 98  CO2 20 23 24 26   GLUCOSE 525* 236* 233* 297*  BUN 13 11 11 11   CREATININE 0.54 0.48* 0.65 0.67  CALCIUM 9.5 7.9* 8.3*  9.1   Liver Function Tests:  Recent Labs Lab 08/09/13 1230  AST 12  ALT 20  ALKPHOS 115  BILITOT <0.2*  PROT 7.6  ALBUMIN 3.4*   No results found for this basename: LIPASE, AMYLASE,  in the last 168 hours No results found for this basename: AMMONIA,  in the last 168 hours CBC:  Recent Labs Lab 08/09/13 1230 08/10/13 0940 08/11/13 0722 08/12/13 0504 08/13/13 0735  WBC 18.4* 13.7* 15.3* 12.5* 11.8*  NEUTROABS 14.2*  --   --   --   --   HGB 14.4 12.4* 12.4* 12.4* 11.4*  HCT 41.5 38.0* 38.6* 37.6* 34.7*  MCV 82.0 85.0 85.8 83.7 83.6  PLT 432* 371 382 402* 393   Cardiac Enzymes: No results found for this basename: CKTOTAL, CKMB, CKMBINDEX, TROPONINI,  in the last 168 hours BNP: No components found with this basename: POCBNP,  CBG:  Recent Labs Lab 08/12/13 1708 08/12/13 2113 08/13/13 0648 08/13/13 1155 08/13/13 1647  GLUCAP 306* 191* 303* 186* 231*    Recent Results (from the past 240 hour(s))  WOUND CULTURE     Status: None   Collection Time    08/10/13  6:55 PM      Result Value Ref Range Status   Specimen Description WOUND HAND RIGHT   Final   Special Requests NONE   Final   Gram Stain     Final   Value: RARE WBC PRESENT,BOTH PMN AND MONONUCLEAR     NO SQUAMOUS EPITHELIAL CELLS SEEN     NO ORGANISMS SEEN     Performed at [facility]   Culture     Final   Value: FEW METHICILLIN RESISTANT STAPHYLOCOCCUS AUREUS     Note: RIFAMPIN AND GENTAMICIN SHOULD NOT BE USED AS SINGLE DRUGS FOR TREATMENT OF STAPH INFECTIONS. This organism DOES NOT demonstrate inducible Clindamycin resistance in vitro. CRITICAL RESULT CALLED TO, READ BACK BY AND VERIFIED WITH: MARISSA E 7/12      @1040  BY REAMM     Performed at [facility]   Report Status 08/13/2013 FINAL   Final   Organism ID, Bacteria METHICILLIN RESISTANT STAPHYLOCOCCUS AUREUS   Final  ANAEROBIC CULTURE     Status: None   Collection Time    08/10/13  6:55 PM      Result Value Ref Range Status     Specimen Description WOUND HAND RIGHT   Final   Special Requests NONE   Final   Gram Stain     Final   Value: RARE WBC PRESENT,BOTH PMN AND MONONUCLEAR     NO SQUAMOUS EPITHELIAL CELLS SEEN     NO ORGANISMS SEEN     Performed at [facility]   Culture     Final   Value: NO ANAEROBES ISOLATED; CULTURE IN PROGRESS FOR 5 DAYS  Performed at [facility]   Report Status PENDING   Incomplete  TISSUE CULTURE     Status: None   Collection Time    08/10/13  6:55 PM      Result Value Ref Range Status   Specimen Description TISSUE FINGER RIGHT   Final   Special Requests NONE   Final   Gram Stain     Final   Value: ABUNDANT WBC PRESENT, PREDOMINANTLY PMN     FEW GRAM POSITIVE COCCI     IN PAIRS IN CLUSTERS     Performed at [facility]   Culture     Final   Value: ABUNDANT METHICILLIN RESISTANT STAPHYLOCOCCUS AUREUS     Note: RIFAMPIN AND GENTAMICIN SHOULD NOT BE USED AS SINGLE DRUGS FOR TREATMENT OF STAPH INFECTIONS. This organism DOES NOT demonstrate inducible Clindamycin resistance in vitro. CRITICAL RESULT CALLED TO, READ BACK BY AND VERIFIED WITH: MARISSA E 7/12      @1040  BY REAM     Performed at [facility]   Report Status 08/13/2013 FINAL   Final   Organism ID, Bacteria METHICILLIN RESISTANT STAPHYLOCOCCUS AUREUS   Final  ANAEROBIC CULTURE     Status: None   Collection Time    08/10/13  6:55 PM      Result Value Ref Range Status   Specimen Description TISSUE FINGER RIGHT   Final   Special Requests NONE   Final   Gram Stain     Final   Value: ABUNDANT WBC PRESENT,BOTH PMN AND MONONUCLEAR     FEW GRAM POSITIVE COCCI     IN PAIRS IN CLUSTERS     Performed at [facility]   Culture     Final   Value: NO ANAEROBES ISOLATED; CULTURE IN PROGRESS FOR 5 DAYS     Performed at [facility]   Report Status PENDING   Incomplete  WOUND CULTURE     Status: None   Collection Time    08/10/13  6:56 PM      Result Value Ref Range Status    Specimen Description WOUND FINGER RIGHT   Final   Special Requests INDEX FINGER TIP   Final   Gram Stain     Final   Value: RARE WBC PRESENT, PREDOMINANTLY PMN     NO SQUAMOUS EPITHELIAL CELLS SEEN     FEW GRAM POSITIVE COCCI     IN PAIRS     Performed at [facility]   Culture     Final   Value: ABUNDANT STAPHYLOCOCCUS AUREUS     Performed at [facility]   Report Status PENDING   Incomplete  ANAEROBIC CULTURE     Status: None   Collection Time    08/10/13  6:56 PM      Result Value Ref Range Status   Specimen Description WOUND FINGER RIGHT   Final   Special Requests INDEX FINGER TIP   Final   Gram Stain     Final   Value: RARE WBC PRESENT, PREDOMINANTLY PMN     NO SQUAMOUS EPITHELIAL CELLS SEEN     FEW GRAM POSITIVE COCCI     IN PAIRS     Performed at [facility]   Culture     Final   Value: NO ANAEROBES ISOLATED; CULTURE IN PROGRESS FOR 5 DAYS     Performed at [facility]   Report Status PENDING   Incomplete  ANAEROBIC CULTURE  Status: None   Collection Time    08/10/13  6:58 PM      Result Value Ref Range Status   Specimen Description TISSUE BONE FINGER RIGHT   Final   Special Requests NONE   Final   Gram Stain     Final   Value: NO WBC SEEN     FEW GRAM POSITIVE COCCI     IN PAIRS     Performed at [facility]   Culture     Final   Value: NO ANAEROBES ISOLATED; CULTURE IN PROGRESS FOR 5 DAYS     Performed at [facility]   Report Status PENDING   Incomplete  TISSUE CULTURE     Status: None   Collection Time    08/10/13  6:58 PM      Result Value Ref Range Status   Specimen Description TISSUE BONE FINGER RIGHT   Final   Special Requests NONE   Final   Gram Stain     Final   Value: NO WBC SEEN     FEW GRAM POSITIVE COCCI     IN PAIRS     Performed at [facility]   Culture     Final   Value: ABUNDANT STAPHYLOCOCCUS AUREUS     ABUNDANT GROUP B STREP(S.AGALACTIAE)ISOLATED     Note: TESTING  AGAINST S. AGALACTIAE NOT ROUTINELY PERFORMED DUE TO PREDICTABILITY OF AMP/PEN/VAN SUSCEPTIBILITY.     Performed at [facility]   Report Status PENDING   Incomplete     Scheduled Meds: . cyclobenzaprine  5 mg Oral TID  . docusate sodium  100 mg Oral BID  . insulin aspart  0-15 Units Subcutaneous TID WC  . insulin aspart  0-5 Units Subcutaneous QHS  . insulin aspart protamine- aspart  30 Units Subcutaneous BID WC  . nicotine  21 mg Transdermal Daily  . senna  2 tablet Oral Daily  . vancomycin  1,000 mg Intravenous Q8H   Continuous Infusions: . sodium chloride 125 mL/hr at 08/13/13 0128  . lactated ringers 50 mL/hr at 08/10/13 1805     Kevin Noah, DO  [facility] Pager [phone]  If 7PM-7AM, please contact night-coverage [email or web address] Password [facility] 08/13/2013, 6:43 PM   LOS: 4 days

## 2013-08-13 NOTE — Progress Notes (Signed)
Lab notified RN that pt wound and tissue cultures were both growing MRSA. MD notified. Pt placed on contact precautions.

## 2013-08-14 ENCOUNTER — Inpatient Hospital Stay (HOSPITAL_COMMUNITY): Payer: MEDICAID

## 2013-08-14 LAB — CBC
HEMATOCRIT: 37.2 % — AB (ref 39.0–52.0)
Hemoglobin: 12.3 g/dL — ABNORMAL LOW (ref 13.0–17.0)
MCH: 27.8 pg (ref 26.0–34.0)
MCHC: 33.1 g/dL (ref 30.0–36.0)
MCV: 84.2 fL (ref 78.0–100.0)
PLATELETS: 479 10*3/uL — AB (ref 150–400)
RBC: 4.42 MIL/uL (ref 4.22–5.81)
RDW: 13.1 % (ref 11.5–15.5)
WBC: 14.3 10*3/uL — AB (ref 4.0–10.5)

## 2013-08-14 LAB — WOUND CULTURE

## 2013-08-14 LAB — TISSUE CULTURE: Gram Stain: NONE SEEN

## 2013-08-14 LAB — GLUCOSE, CAPILLARY
GLUCOSE-CAPILLARY: 169 mg/dL — AB (ref 70–99)
GLUCOSE-CAPILLARY: 176 mg/dL — AB (ref 70–99)
Glucose-Capillary: 156 mg/dL — ABNORMAL HIGH (ref 70–99)
Glucose-Capillary: 208 mg/dL — ABNORMAL HIGH (ref 70–99)

## 2013-08-14 MED ORDER — MAGNESIUM CITRATE PO SOLN
1.0000 | Freq: Once | ORAL | Status: AC
  Administered 2013-08-14: 1 via ORAL
  Filled 2013-08-14: qty 296

## 2013-08-14 MED ORDER — METHOCARBAMOL 500 MG PO TABS
500.0000 mg | ORAL_TABLET | Freq: Four times a day (QID) | ORAL | Status: DC
  Administered 2013-08-14 – 2013-08-15 (×2): 500 mg via ORAL
  Filled 2013-08-14 (×2): qty 1

## 2013-08-14 MED ORDER — INSULIN ASPART PROT & ASPART (70-30 MIX) 100 UNIT/ML ~~LOC~~ SUSP
34.0000 [IU] | Freq: Two times a day (BID) | SUBCUTANEOUS | Status: DC
  Administered 2013-08-14 – 2013-08-15 (×2): 34 [IU] via SUBCUTANEOUS

## 2013-08-14 MED ORDER — CYCLOBENZAPRINE HCL 10 MG PO TABS: 10.0000 mg | ORAL_TABLET | Freq: Three times a day (TID) | ORAL | Status: DC

## 2013-08-14 MED ORDER — KETOROLAC TROMETHAMINE 30 MG/ML IJ SOLN
30.0000 mg | Freq: Once | INTRAMUSCULAR | Status: AC
  Administered 2013-08-14: 30 mg via INTRAVENOUS
  Filled 2013-08-14: qty 1

## 2013-08-14 MED ORDER — TRAMADOL HCL 50 MG PO TABS
50.0000 mg | ORAL_TABLET | Freq: Four times a day (QID) | ORAL | Status: DC | PRN
  Administered 2013-08-14 – 2013-08-15 (×2): 50 mg via ORAL
  Filled 2013-08-14 (×2): qty 1

## 2013-08-14 NOTE — Progress Notes (Signed)
NOTES REVIEWED MRSA + WILL BE BY TODAY TO LOOK AT WOUNDS  CHANGE DRESSING

## 2013-08-14 NOTE — Progress Notes (Signed)
PROGRESS NOTE  Kevin Woodward:[id number] DOB: 03-17-1970 DOA: 08/09/2013 PCP: No PCP Per Patient  Assessment/Plan: Sepsis  -Present at the time of admission with tachycardia and leukocytosis  -Secondary to hand infection  Osteomyelitis of Phalanx  -discussed intraoperative findings with Dr. Melvyn Novas  -s/p I&D 08/10/13-->follow cultures-->MRSA and GBS  Cellulitis/abscesss hand and digits  -d/c zosyn  -continue vanco  -plan to d/c with Bactrim DS and amoxil  Diabetes mellitus, type II, uncontrolled  -Hemoglobin A1c 13.5  -switch to 70/30 insulin due to pt financial situation and cost of Lantus  -increase 70/30 to 34 units bid  -NovoLog sliding scale  -Patient now agrees to taking insulin  -Diabetic coordinator has provided education and pt was seen by dietitician PseudoHyponatremia  -improved with IVF and tx of DM  - Secondary to hyperglycemia  -IV fluids  Rib pain/flank pain -rib series--neg  -start flexeril--some improvement  -08/14/13 CT abd--neg for nephrolithiasis or acute pathology Tobacco abuse  -Tobacco cessation discussed  -nicoderm patch deferred b/c it is causing bad dreams  Constipation  -colace and Senna  -add bisacodyl  -try mag citrate Family Communication: wife at beside  Disposition Plan: Home when medically stable  Antibiotics:  Zosyn 08/09/13>>> 08/12/13  Vanco 08/11/13>>>        Procedures/Studies: Ct Abdomen Pelvis Wo Contrast  08/14/2013   CLINICAL DATA:  Right flank pain.  Evaluate for nephrolithiasis.  EXAM: CT ABDOMEN AND PELVIS WITHOUT CONTRAST  TECHNIQUE: Multidetector CT imaging of the abdomen and pelvis was performed following the standard protocol without IV contrast.  COMPARISON:  None.  FINDINGS: Lung Bases: Dependent bibasilar collapse/ consolidation is evident. Left-sided gynecomastia is evident. The right nipple has not been included on the study.  Liver:  Normal uninfused appearance.  Spleen: Normal uninfused appearance.   Stomach: Distended with food.  Pancreas: No focal mass lesion.  No dilatation of the main duct.  Gallbladder/Biliary: Layering high attenuation suggests sludge. No definite gallstones. No intra or extrahepatic biliary duct dilatation.  Kidneys/Adrenals: No adrenal nodule or mass. No evidence for stone disease in either kidney. No hydronephrosis or perinephric edema/ inflammation. No hydroureter. No ureteral stones. No bladder stones.  Bowel Loops: Duodenum and ligament of Treitz are in their expected anatomic locations. No small bowel obstruction. Terminal ileum is normal. The appendix is normal. No colonic diverticulitis. No gross colonic mass lesion.  Nodes: No abdominal lymphadenopathy. No evidence for pelvic sidewall lymphadenopathy.  Vasculature: No abdominal aortic aneurysm.  Pelvic Genitourinary: Bladder is distended. Scattered dystrophic calcification is seen in the prostate gland.  Bones/Musculoskeletal: Bone windows reveal no worrisome lytic or sclerotic osseous lesions.  Body Wall: No evidence for ventral hernia. Gas bubbles in the subcutaneous fat of the right anterior abdominal wall are presumably from an injection. There is a small left inguinal hernia containing only fat. Subcutaneous edema is seen in the lower abdominal wall in pelvis.  Other: No evidence for intraperitoneal free fluid  IMPRESSION: 1. No CT findings to explain the patient's history of right flank pain. 2. Gynecomastia. 3. Small left inguinal hernia contains only fat. 4. Mild body wall edema in the lower abdomen and pelvis.   Electronically Signed   By: Kennith Center M.D.   On: 08/14/2013 13:50   Dg Ribs Unilateral W/chest Right  08/13/2013   CLINICAL DATA:  Right-sided rib pain.  No injury.  EXAM: RIGHT RIBS AND CHEST - 3+ VIEW  COMPARISON:  None.  FINDINGS: No  fracture or other bone lesions are seen involving the ribs. There is no evidence of pneumothorax or pleural effusion. Both lungs are clear. Heart size and mediastinal  contours are within normal limits.  IMPRESSION: Negative.   Electronically Signed   By: Burman Nieves M.D.   On: 08/13/2013 03:41   Dg Finger Middle Right  08/09/2013   CLINICAL DATA:  HAND PAIN  EXAM: RIGHT MIDDLE FINGER 2+V  COMPARISON:  None.  FINDINGS: There is no evidence of fracture or dislocation. The cortical margins appear intact. There is no evidence of arthropathy or other focal bone abnormality. A soft tissue ulceration along the distal tip of the third digit.  IMPRESSION: Soft tissue ulceration overlying the distal tuft of the third digit. There is no evidence of cortical destruction nor acute osseous abnormalities.   Electronically Signed   By: Salome Holmes M.D.   On: 08/09/2013 13:42         Subjective: Patient complaint of right flank pain. It only hurts him when he was moving a certain way. He denies any fevers, chills, chest pain, shortness breath, nausea vomiting, diarrhea, abdominal pain, dysuria, hematuria. No rashes.  Objective: Filed Vitals:   08/13/13 1419 08/14/13 0541 08/14/13 1000 08/14/13 1414  BP: 140/80 148/86 130/82 133/70  Pulse: 86 85 83 97  Temp: 97.8 F (36.6 C) 98.1 F (36.7 C) 98.5 F (36.9 C) 98.6 F (37 C)  TempSrc: Oral Oral Oral Oral  Resp: 18 20 20 20   Height:      Weight:      SpO2: 98% 95% 100% 95%    Intake/Output Summary (Last 24 hours) at 08/14/13 1915 Last data filed at 08/14/13 1700  Gross per 24 hour  Intake 14704.58 ml  Output      0 ml  Net 14704.58 ml   Weight change:  Exam:   General:  Pt is alert, follows commands appropriately, not in acute distress  HEENT: No icterus, No thrush, [place]/AT  Cardiovascular: RRR, S1/S2, no rubs, no gallops  Respiratory: CTA bilaterally, no wheezing, no crackles, no rhonchi  Abdomen: Soft/+BS, non tender, non distended, no guarding  Extremities: R- arm in bulky dressing; R-flank tender to palpation without any rash, crepitance, open wounds.  Data Reviewed: Basic Metabolic  Panel:  Recent Labs Lab 08/09/13 1230 08/10/13 0940 08/11/13 0722 08/12/13 0504  NA 128* 139 135* 137  K 4.4 3.8 3.9 4.2  CL 90* 103 97 98  CO2 20 23 24 26   GLUCOSE 525* 236* 233* 297*  BUN 13 11 11 11   CREATININE 0.54 0.48* 0.65 0.67  CALCIUM 9.5 7.9* 8.3* 9.1   Liver Function Tests:  Recent Labs Lab 08/09/13 1230  AST 12  ALT 20  ALKPHOS 115  BILITOT <0.2*  PROT 7.6  ALBUMIN 3.4*   No results found for this basename: LIPASE, AMYLASE,  in the last 168 hours No results found for this basename: AMMONIA,  in the last 168 hours CBC:  Recent Labs Lab 08/09/13 1230 08/10/13 0940 08/11/13 0722 08/12/13 0504 08/13/13 0735 08/14/13 0641  WBC 18.4* 13.7* 15.3* 12.5* 11.8* 14.3*  NEUTROABS 14.2*  --   --   --   --   --   HGB 14.4 12.4* 12.4* 12.4* 11.4* 12.3*  HCT 41.5 38.0* 38.6* 37.6* 34.7* 37.2*  MCV 82.0 85.0 85.8 83.7 83.6 84.2  PLT 432* 371 382 402* 393 479*   Cardiac Enzymes: No results found for this basename: CKTOTAL, CKMB, CKMBINDEX, TROPONINI,  in the  last 168 hours BNP: No components found with this basename: POCBNP,  CBG:  Recent Labs Lab 08/13/13 1647 08/13/13 2118 08/14/13 0626 08/14/13 1129 08/14/13 1637  GLUCAP 231* 146* 169* 156* 208*    Recent Results (from the past 240 hour(s))  WOUND CULTURE     Status: None   Collection Time    08/10/13  6:55 PM      Result Value Ref Range Status   Specimen Description WOUND HAND RIGHT   Final   Special Requests NONE   Final   Gram Stain     Final   Value: RARE WBC PRESENT,BOTH PMN AND MONONUCLEAR     NO SQUAMOUS EPITHELIAL CELLS SEEN     NO ORGANISMS SEEN     Performed at [facility]   Culture     Final   Value: FEW METHICILLIN RESISTANT STAPHYLOCOCCUS AUREUS     Note: RIFAMPIN AND GENTAMICIN SHOULD NOT BE USED AS SINGLE DRUGS FOR TREATMENT OF STAPH INFECTIONS. This organism DOES NOT demonstrate inducible Clindamycin resistance in vitro. CRITICAL RESULT CALLED TO, READ BACK BY AND  VERIFIED WITH: MARISSA E 7/12      @1040  BY REAMM     Performed at [facility]   Report Status 08/13/2013 FINAL   Final   Organism ID, Bacteria METHICILLIN RESISTANT STAPHYLOCOCCUS AUREUS   Final  ANAEROBIC CULTURE     Status: None   Collection Time    08/10/13  6:55 PM      Result Value Ref Range Status   Specimen Description WOUND HAND RIGHT   Final   Special Requests NONE   Final   Gram Stain     Final   Value: RARE WBC PRESENT,BOTH PMN AND MONONUCLEAR     NO SQUAMOUS EPITHELIAL CELLS SEEN     NO ORGANISMS SEEN     Performed at [facility]   Culture     Final   Value: NO ANAEROBES ISOLATED; CULTURE IN PROGRESS FOR 5 DAYS     Performed at [facility]   Report Status PENDING   Incomplete  TISSUE CULTURE     Status: None   Collection Time    08/10/13  6:55 PM      Result Value Ref Range Status   Specimen Description TISSUE FINGER RIGHT   Final   Special Requests NONE   Final   Gram Stain     Final   Value: ABUNDANT WBC PRESENT, PREDOMINANTLY PMN     FEW GRAM POSITIVE COCCI     IN PAIRS IN CLUSTERS     Performed at [facility]   Culture     Final   Value: ABUNDANT METHICILLIN RESISTANT STAPHYLOCOCCUS AUREUS     Note: RIFAMPIN AND GENTAMICIN SHOULD NOT BE USED AS SINGLE DRUGS FOR TREATMENT OF STAPH INFECTIONS. This organism DOES NOT demonstrate inducible Clindamycin resistance in vitro. CRITICAL RESULT CALLED TO, READ BACK BY AND VERIFIED WITH: MARISSA E 7/12      @1040  BY REAM     Performed at [facility]   Report Status 08/13/2013 FINAL   Final   Organism ID, Bacteria METHICILLIN RESISTANT STAPHYLOCOCCUS AUREUS   Final  ANAEROBIC CULTURE     Status: None   Collection Time    08/10/13  6:55 PM      Result Value Ref Range Status   Specimen Description TISSUE FINGER RIGHT   Final   Special Requests NONE   Final   Gram Stain  Final   Value: ABUNDANT WBC PRESENT,BOTH PMN AND MONONUCLEAR     FEW GRAM POSITIVE COCCI     IN PAIRS  IN CLUSTERS     Performed at [facility]   Culture     Final   Value: NO ANAEROBES ISOLATED; CULTURE IN PROGRESS FOR 5 DAYS     Performed at [facility]   Report Status PENDING   Incomplete  WOUND CULTURE     Status: None   Collection Time    08/10/13  6:56 PM      Result Value Ref Range Status   Specimen Description WOUND FINGER RIGHT   Final   Special Requests INDEX FINGER TIP   Final   Gram Stain     Final   Value: RARE WBC PRESENT, PREDOMINANTLY PMN     NO SQUAMOUS EPITHELIAL CELLS SEEN     FEW GRAM POSITIVE COCCI     IN PAIRS     Performed at [facility]   Culture     Final   Value: ABUNDANT METHICILLIN RESISTANT STAPHYLOCOCCUS AUREUS     Note: RIFAMPIN AND GENTAMICIN SHOULD NOT BE USED AS SINGLE DRUGS FOR TREATMENT OF STAPH INFECTIONS. This organism DOES NOT demonstrate inducible Clindamycin resistance in vitro. CRITICAL RESULT CALLED TO, READ BACK BY AND VERIFIED WITH: LAURA JAMA @      11:35AM 08/14/13 BY DWEEKS     Performed at [facility]   Report Status 08/14/2013 FINAL   Final   Organism ID, Bacteria METHICILLIN RESISTANT STAPHYLOCOCCUS AUREUS   Final  ANAEROBIC CULTURE     Status: None   Collection Time    08/10/13  6:56 PM      Result Value Ref Range Status   Specimen Description WOUND FINGER RIGHT   Final   Special Requests INDEX FINGER TIP   Final   Gram Stain     Final   Value: RARE WBC PRESENT, PREDOMINANTLY PMN     NO SQUAMOUS EPITHELIAL CELLS SEEN     FEW GRAM POSITIVE COCCI     IN PAIRS     Performed at [facility]   Culture     Final   Value: NO ANAEROBES ISOLATED; CULTURE IN PROGRESS FOR 5 DAYS     Performed at [facility]   Report Status PENDING   Incomplete  ANAEROBIC CULTURE     Status: None   Collection Time    08/10/13  6:58 PM      Result Value Ref Range Status   Specimen Description TISSUE BONE FINGER RIGHT   Final   Special Requests NONE   Final   Gram Stain     Final   Value: NO WBC  SEEN     FEW GRAM POSITIVE COCCI     IN PAIRS     Performed at [facility]   Culture     Final   Value: NO ANAEROBES ISOLATED; CULTURE IN PROGRESS FOR 5 DAYS     Performed at [facility]   Report Status PENDING   Incomplete  TISSUE CULTURE     Status: None   Collection Time    08/10/13  6:58 PM      Result Value Ref Range Status   Specimen Description TISSUE BONE FINGER RIGHT   Final   Special Requests NONE   Final   Gram Stain     Final   Value: NO WBC SEEN     FEW  GRAM POSITIVE COCCI     IN PAIRS     Performed at [facility]   Culture     Final   Value: ABUNDANT METHICILLIN RESISTANT STAPHYLOCOCCUS AUREUS     Note: RIFAMPIN AND GENTAMICIN SHOULD NOT BE USED AS SINGLE DRUGS FOR TREATMENT OF STAPH INFECTIONS. This organism DOES NOT demonstrate inducible Clindamycin resistance in vitro. CRITICAL RESULT CALLED TO, READ BACK BY AND VERIFIED WITH: LAURA JAMA @      11:35AM 08/14/13 BY DWEEKS     ABUNDANT GROUP B STREP(S.AGALACTIAE)ISOLATED     Note: TESTING AGAINST S. AGALACTIAE NOT ROUTINELY PERFORMED DUE TO PREDICTABILITY OF AMP/PEN/VAN SUSCEPTIBILITY.     Performed at [facility]   Report Status 08/14/2013 FINAL   Final   Organism ID, Bacteria METHICILLIN RESISTANT STAPHYLOCOCCUS AUREUS   Final     Scheduled Meds: . bisacodyl  10 mg Rectal Once  . cyclobenzaprine  5 mg Oral TID  . docusate sodium  100 mg Oral BID  . enoxaparin (LOVENOX) injection  40 mg Subcutaneous Q24H  . insulin aspart  0-15 Units Subcutaneous TID WC  . insulin aspart  0-5 Units Subcutaneous QHS  . insulin aspart protamine- aspart  34 Units Subcutaneous BID WC  . magnesium citrate  1 Bottle Oral Once  . senna  2 tablet Oral Daily  . vancomycin  1,000 mg Intravenous Q8H   Continuous Infusions: . sodium chloride 125 mL/hr at 08/14/13 0544  . lactated ringers 50 mL/hr at 08/10/13 1805     Jorene Kaylor, DO  [facility] Pager [phone]  If 7PM-7AM, please contact  night-coverage [email or web address] Password [email or web address] 08/14/2013, 7:15 PM   LOS: 5 days

## 2013-08-15 LAB — COMPREHENSIVE METABOLIC PANEL
ALK PHOS: 122 U/L — AB (ref 39–117)
ALT: 18 U/L (ref 0–53)
AST: 13 U/L (ref 0–37)
Albumin: 2.7 g/dL — ABNORMAL LOW (ref 3.5–5.2)
Anion gap: 13 (ref 5–15)
BUN: 12 mg/dL (ref 6–23)
CO2: 27 mEq/L (ref 19–32)
Calcium: 8.9 mg/dL (ref 8.4–10.5)
Chloride: 100 mEq/L (ref 96–112)
Creatinine, Ser: 0.63 mg/dL (ref 0.50–1.35)
GFR calc non Af Amer: >90 mL/min (ref >90)
GLUCOSE: 111 mg/dL — AB (ref 70–99)
POTASSIUM: 3.9 meq/L (ref 3.7–5.3)
SODIUM: 140 meq/L (ref 137–147)
TOTAL PROTEIN: 6.6 g/dL (ref 6.0–8.3)
Total Bilirubin: 0.3 mg/dL (ref 0.3–1.2)

## 2013-08-15 LAB — ANAEROBIC CULTURE: Gram Stain: NONE SEEN

## 2013-08-15 LAB — CBC
HEMATOCRIT: 37 % — AB (ref 39.0–52.0)
HEMOGLOBIN: 11.8 g/dL — AB (ref 13.0–17.0)
MCH: 27.2 pg (ref 26.0–34.0)
MCHC: 31.9 g/dL (ref 30.0–36.0)
MCV: 85.3 fL (ref 78.0–100.0)
Platelets: 457 10*3/uL — ABNORMAL HIGH (ref 150–400)
RBC: 4.34 MIL/uL (ref 4.22–5.81)
RDW: 13.2 % (ref 11.5–15.5)
WBC: 10 10*3/uL (ref 4.0–10.5)

## 2013-08-15 LAB — GLUCOSE, CAPILLARY
Glucose-Capillary: 117 mg/dL — ABNORMAL HIGH (ref 70–99)
Glucose-Capillary: 119 mg/dL — ABNORMAL HIGH (ref 70–99)

## 2013-08-15 MED ORDER — "INSULIN SYRINGE 30G X 5/16"" 1 ML MISC": Status: AC

## 2013-08-15 MED ORDER — SULFAMETHOXAZOLE-TMP DS 800-160 MG PO TABS: 2.0000 | ORAL_TABLET | Freq: Two times a day (BID) | ORAL | Status: AC

## 2013-08-15 MED ORDER — METHOCARBAMOL 500 MG PO TABS: 500.0000 mg | ORAL_TABLET | Freq: Four times a day (QID) | ORAL | Status: AC

## 2013-08-15 MED ORDER — UNABLE TO FIND: Status: AC

## 2013-08-15 MED ORDER — AMOXICILLIN 500 MG PO TABS: 500.0000 mg | ORAL_TABLET | Freq: Two times a day (BID) | ORAL | Status: AC

## 2013-08-15 MED ORDER — "INSULIN SYRINGE 30G X 5/16"" 1 ML MISC": Status: DC

## 2013-08-15 MED ORDER — INSULIN NPH ISOPHANE & REGULAR (70-30) 100 UNIT/ML ~~LOC~~ SUSP: 34.0000 [IU] | Freq: Two times a day (BID) | SUBCUTANEOUS | Status: AC

## 2013-08-15 NOTE — Progress Notes (Signed)
Patient experienced a large BM after several interventions. Patient states that he is not currently experiencing any lower flank back pain at this time.

## 2013-08-15 NOTE — Discharge Instructions (Signed)
KEEP BANDAGE CLEAN AND DRY °CALL OFFICE FOR F/U APPT [phone] IN 7 DAYS °KEEP HAND ELEVATED ABOVE HEART °OK TO APPLY ICE TO OPERATIVE AREA °CONTACT OFFICE IF ANY WORSENING PAIN OR CONCERNS. °

## 2013-08-15 NOTE — Discharge Summary (Signed)
Physician Discharge Summary  MERCURY ROCK OZH:[id number] DOB: Nov 13, 1970 DOA: 08/09/2013  PCP: No PCP Per Patient  Admit date: 08/09/2013 Discharge date: 08/15/2013  Recommendations for Outpatient Follow-up:  1. Follow up with Dr. Melvyn Novas on 08/21/13   Discharge Diagnoses:  Sepsis  -Present at the time of admission with tachycardia and leukocytosis  -Secondary to hand infection  -resolved Osteomyelitis of Phalanx  -discussed intraoperative findings with Dr. Melvyn Novas  -s/p I&D 08/10/13-->follow cultures-->MRSA and GBS  Cellulitis/abscesss hand and digits  -d/c zosyn  -continue vanco  -plan to d/c with Bactrim DS and amoxil for 10 more days which will complete 14 days of therapy from the day of surgery  Diabetes mellitus, type II, uncontrolled  -Hemoglobin A1c 13.5  -switch to 70/30 insulin due to pt financial situation and cost of Lantus  -increase 70/30 to 34 units bid  -NovoLog sliding scale  -Patient now agrees to taking insulin  -Diabetic coordinator has provided education and pt was seen by dietitician  PseudoHyponatremia  -improved with IVF and tx of DM  - Secondary to hyperglycemia  -IV fluids  Rib pain/flank pain  -rib series--neg  -partly due to constipation -pain improving after BM -Rx Robaxin for d/c -08/14/13 CT abd--neg for nephrolithiasis or acute pathology  Tobacco abuse  -Tobacco cessation discussed  -nicoderm patch deferred b/c it is causing bad dreams  Constipation  -colace and Senna  -add bisacodyl  -try mag citrate-->BM Family Communication: wife at beside  Disposition Plan: Home --total time for discharge 45 minutes Antibiotics:  Zosyn 08/09/13>>> 08/12/13  Vanco 08/11/13>>>   Discharge Condition: stable  Disposition:  Follow-up Information   Follow up with  [facility]    .   Contact information:   [address] [place] [address] [phone]      Follow up with Sharma Covert, MD. Schedule an  appointment as soon as possible for a visit in 7 days.   Specialty:  Orthopedic Surgery   Contact information:   [address] [place] [address] [phone]       Please follow up. (08/21/13)       Diet:home Wt Readings from Last 3 Encounters:  08/10/13 103.692 kg (228 lb 9.6 oz)  08/10/13 103.692 kg (228 lb 9.6 oz)  08/09/13 111.131 kg (245 lb)    History of present illness:  43 year old male with a history of diabetes mellitus diagnosed 10 years ago presented with one month history of right middle finger infection. The patient works as a Actor and feels that the trauma associated with his job he may have resulted in abrasions on his hands. Approximately 3 weeks ago, the patient attempted to lance a wound on his distal third finger with a razor blade. 2 days prior to admission, the patient also attempted to lance it is a wound on his index finger as well as the palm of his hand. The patient presented because of increasing edema, erythema, and pain of his hand. In emergency department, the patient's serum glucose initially was 525 with normal bicarbonate. Unfortunately, the patient left the emergency department against medical advice 2 times before coming back a third time for admission. He ultimately agreed to stay for admission. Initially, the patient was resistant to starting on insulin. His hemoglobin A1c was 13.5. The patient was started on IV fluids and subcutaneous insulin. Because of his financial situation, the patient was started on 70/30 insulin. His dose was titrated up to 34 units twice a day.  His glycemic control was optimized during the hospitalization. And surgery was consulted to see the patient.  Dr. Melvyn Novasrtmann performed irrigation and alignment on 08/10/2013. Surgical cultures grew MRSA and group B streptococcus. The patient was continued on intravenous vancomycin. Zosyn was discontinued. The patient will be discharged with Bactrim DS 2 tablets twice a day  and Amoxil 500 mg 3 times a day for 10 additional days which will complete 14 days of therapy from the day of surgery. The patient has a followup appointment with Dr. Melvyn Novasrtmann on 08/21/2013.   Consultants: Hand--Ortmann  Discharge Exam: Filed Vitals:   08/15/13 0216  BP: 138/78  Pulse: 82  Temp: 98.1 F (36.7 C)  Resp: 18   Filed Vitals:   08/14/13 1000 08/14/13 1414 08/14/13 2050 08/15/13 0216  BP: 130/82 133/70 136/79 138/78  Pulse: 83 97 99 82  Temp: 98.5 F (36.9 C) 98.6 F (37 C) 98 F (36.7 C) 98.1 F (36.7 C)  TempSrc: Oral Oral Oral Oral  Resp: 20 20 18 18   Height:      Weight:      SpO2: 100% 95% 100% 100%   General: A&O x 3, NAD, pleasant, cooperative Cardiovascular: RRR, no rub, no gallop, no S3 Respiratory: CTAB, no wheeze, no rhonchi Abdomen:soft, nontender, nondistended, positive bowel sounds  Discharge Instructions      Discharge Instructions   Diet - low sodium heart healthy    Complete by:  As directed      Increase activity slowly    Complete by:  As directed             Medication List    STOP taking these medications       ALEVE PO     Aspirin-Acetaminophen 500-325 MG Pack     NEOSPORIN EX      TAKE these medications       amoxicillin 500 MG tablet  Commonly known as:  AMOXIL  Take 1 tablet (500 mg total) by mouth 2 (two) times daily.     insulin NPH-regular Human (70-30) 100 UNIT/ML injection  Commonly known as:  NOVOLIN 70/30  Inject 34 Units into the skin 2 (two) times daily with a meal.     INSULIN SYRINGE 1CC/30GX5/16" 30G X 5/16" 1 ML Misc  Use insulin syringe and need to dispense your insulin two times daily     methocarbamol 500 MG tablet  Commonly known as:  ROBAXIN  Take 1 tablet (500 mg total) by mouth 4 (four) times daily.     sulfamethoxazole-trimethoprim 800-160 MG per tablet  Commonly known as:  BACTRIM DS  Take 2 tablets by mouth 2 (two) times daily.     UNABLE TO FIND  Ms. Cheron SchaumannJosie White was present in  the hospital to assist in the care for her hospitalized husband from 08/09/13 through 08/15/13         The results of significant diagnostics from this hospitalization (including imaging, microbiology, ancillary and laboratory) are listed below for reference.    Significant Diagnostic Studies: Ct Abdomen Pelvis Wo Contrast  08/14/2013   CLINICAL DATA:  Right flank pain.  Evaluate for nephrolithiasis.  EXAM: CT ABDOMEN AND PELVIS WITHOUT CONTRAST  TECHNIQUE: Multidetector CT imaging of the abdomen and pelvis was performed following the standard protocol without IV contrast.  COMPARISON:  None.  FINDINGS: Lung Bases: Dependent bibasilar collapse/ consolidation is evident. Left-sided gynecomastia is evident. The right nipple has not been included on the study.  Liver:  Normal uninfused appearance.  Spleen: Normal uninfused appearance.  Stomach: Distended with food.  Pancreas: No focal mass lesion.  No dilatation of the main duct.  Gallbladder/Biliary: Layering high attenuation suggests sludge. No definite gallstones. No intra or extrahepatic biliary duct dilatation.  Kidneys/Adrenals: No adrenal nodule or mass. No evidence for stone disease in either kidney. No hydronephrosis or perinephric edema/ inflammation. No hydroureter. No ureteral stones. No bladder stones.  Bowel Loops: Duodenum and ligament of Treitz are in their expected anatomic locations. No small bowel obstruction. Terminal ileum is normal. The appendix is normal. No colonic diverticulitis. No gross colonic mass lesion.  Nodes: No abdominal lymphadenopathy. No evidence for pelvic sidewall lymphadenopathy.  Vasculature: No abdominal aortic aneurysm.  Pelvic Genitourinary: Bladder is distended. Scattered dystrophic calcification is seen in the prostate gland.  Bones/Musculoskeletal: Bone windows reveal no worrisome lytic or sclerotic osseous lesions.  Body Wall: No evidence for ventral hernia. Gas bubbles in the subcutaneous fat of the right  anterior abdominal wall are presumably from an injection. There is a small left inguinal hernia containing only fat. Subcutaneous edema is seen in the lower abdominal wall in pelvis.  Other: No evidence for intraperitoneal free fluid  IMPRESSION: 1. No CT findings to explain the patient's history of right flank pain. 2. Gynecomastia. 3. Small left inguinal hernia contains only fat. 4. Mild body wall edema in the lower abdomen and pelvis.   Electronically Signed   By: Kennith Center M.D.   On: 08/14/2013 13:50   Dg Ribs Unilateral W/chest Right  08/13/2013   CLINICAL DATA:  Right-sided rib pain.  No injury.  EXAM: RIGHT RIBS AND CHEST - 3+ VIEW  COMPARISON:  None.  FINDINGS: No fracture or other bone lesions are seen involving the ribs. There is no evidence of pneumothorax or pleural effusion. Both lungs are clear. Heart size and mediastinal contours are within normal limits.  IMPRESSION: Negative.   Electronically Signed   By: Burman Nieves M.D.   On: 08/13/2013 03:41   Dg Finger Middle Right  08/09/2013   CLINICAL DATA:  HAND PAIN  EXAM: RIGHT MIDDLE FINGER 2+V  COMPARISON:  None.  FINDINGS: There is no evidence of fracture or dislocation. The cortical margins appear intact. There is no evidence of arthropathy or other focal bone abnormality. A soft tissue ulceration along the distal tip of the third digit.  IMPRESSION: Soft tissue ulceration overlying the distal tuft of the third digit. There is no evidence of cortical destruction nor acute osseous abnormalities.   Electronically Signed   By: Salome Holmes M.D.   On: 08/09/2013 13:42     Microbiology: Recent Results (from the past 240 hour(s))  WOUND CULTURE     Status: None   Collection Time    08/10/13  6:55 PM      Result Value Ref Range Status   Specimen Description WOUND HAND RIGHT   Final   Special Requests NONE   Final   Gram Stain     Final   Value: RARE WBC PRESENT,BOTH PMN AND MONONUCLEAR     NO SQUAMOUS EPITHELIAL CELLS SEEN      NO ORGANISMS SEEN     Performed at [facility]   Culture     Final   Value: FEW METHICILLIN RESISTANT STAPHYLOCOCCUS AUREUS     Note: RIFAMPIN AND GENTAMICIN SHOULD NOT BE USED AS SINGLE DRUGS FOR TREATMENT OF STAPH INFECTIONS. This organism DOES NOT demonstrate inducible Clindamycin resistance in vitro. CRITICAL RESULT CALLED TO, READ BACK BY  AND VERIFIED WITH: MARISSA E 7/12      @1040  BY REAMM     Performed at [facility]   Report Status 08/13/2013 FINAL   Final   Organism ID, Bacteria METHICILLIN RESISTANT STAPHYLOCOCCUS AUREUS   Final  ANAEROBIC CULTURE     Status: None   Collection Time    08/10/13  6:55 PM      Result Value Ref Range Status   Specimen Description WOUND HAND RIGHT   Final   Special Requests NONE   Final   Gram Stain     Final   Value: RARE WBC PRESENT,BOTH PMN AND MONONUCLEAR     NO SQUAMOUS EPITHELIAL CELLS SEEN     NO ORGANISMS SEEN     Performed at [facility]   Culture     Final   Value: NO ANAEROBES ISOLATED     Performed at [facility]   Report Status 08/15/2013 FINAL   Final  TISSUE CULTURE     Status: None   Collection Time    08/10/13  6:55 PM      Result Value Ref Range Status   Specimen Description TISSUE FINGER RIGHT   Final   Special Requests NONE   Final   Gram Stain     Final   Value: ABUNDANT WBC PRESENT, PREDOMINANTLY PMN     FEW GRAM POSITIVE COCCI     IN PAIRS IN CLUSTERS     Performed at [facility]   Culture     Final   Value: ABUNDANT METHICILLIN RESISTANT STAPHYLOCOCCUS AUREUS     Note: RIFAMPIN AND GENTAMICIN SHOULD NOT BE USED AS SINGLE DRUGS FOR TREATMENT OF STAPH INFECTIONS. This organism DOES NOT demonstrate inducible Clindamycin resistance in vitro. CRITICAL RESULT CALLED TO, READ BACK BY AND VERIFIED WITH: MARISSA E 7/12      @1040  BY REAM     Performed at [facility]   Report Status 08/13/2013 FINAL   Final   Organism ID, Bacteria METHICILLIN RESISTANT STAPHYLOCOCCUS  AUREUS   Final  ANAEROBIC CULTURE     Status: None   Collection Time    08/10/13  6:55 PM      Result Value Ref Range Status   Specimen Description TISSUE FINGER RIGHT   Final   Special Requests NONE   Final   Gram Stain     Final   Value: ABUNDANT WBC PRESENT,BOTH PMN AND MONONUCLEAR     FEW GRAM POSITIVE COCCI     IN PAIRS IN CLUSTERS     Performed at [facility]   Culture     Final   Value: NO ANAEROBES ISOLATED     Performed at [facility]   Report Status 08/15/2013 FINAL   Final  WOUND CULTURE     Status: None   Collection Time    08/10/13  6:56 PM      Result Value Ref Range Status   Specimen Description WOUND FINGER RIGHT   Final   Special Requests INDEX FINGER TIP   Final   Gram Stain     Final   Value: RARE WBC PRESENT, PREDOMINANTLY PMN     NO SQUAMOUS EPITHELIAL CELLS SEEN     FEW GRAM POSITIVE COCCI     IN PAIRS     Performed at [facility]   Culture     Final   Value: ABUNDANT METHICILLIN RESISTANT STAPHYLOCOCCUS AUREUS     Note: RIFAMPIN AND  GENTAMICIN SHOULD NOT BE USED AS SINGLE DRUGS FOR TREATMENT OF STAPH INFECTIONS. This organism DOES NOT demonstrate inducible Clindamycin resistance in vitro. CRITICAL RESULT CALLED TO, READ BACK BY AND VERIFIED WITH: LAURA JAMA @      11:35AM 08/14/13 BY DWEEKS     Performed at [facility]   Report Status 08/14/2013 FINAL   Final   Organism ID, Bacteria METHICILLIN RESISTANT STAPHYLOCOCCUS AUREUS   Final  ANAEROBIC CULTURE     Status: None   Collection Time    08/10/13  6:56 PM      Result Value Ref Range Status   Specimen Description WOUND FINGER RIGHT   Final   Special Requests INDEX FINGER TIP   Final   Gram Stain     Final   Value: RARE WBC PRESENT, PREDOMINANTLY PMN     NO SQUAMOUS EPITHELIAL CELLS SEEN     FEW GRAM POSITIVE COCCI     IN PAIRS     Performed at [facility]   Culture     Final   Value: NO ANAEROBES ISOLATED     Performed at [facility]    Report Status 08/15/2013 FINAL   Final  ANAEROBIC CULTURE     Status: None   Collection Time    08/10/13  6:58 PM      Result Value Ref Range Status   Specimen Description TISSUE BONE FINGER RIGHT   Final   Special Requests NONE   Final   Gram Stain     Final   Value: NO WBC SEEN     FEW GRAM POSITIVE COCCI     IN PAIRS     Performed at [facility]   Culture     Final   Value: NO ANAEROBES ISOLATED     Performed at [facility]   Report Status 08/15/2013 FINAL   Final  TISSUE CULTURE     Status: None   Collection Time    08/10/13  6:58 PM      Result Value Ref Range Status   Specimen Description TISSUE BONE FINGER RIGHT   Final   Special Requests NONE   Final   Gram Stain     Final   Value: NO WBC SEEN     FEW GRAM POSITIVE COCCI     IN PAIRS     Performed at [facility]   Culture     Final   Value: ABUNDANT METHICILLIN RESISTANT STAPHYLOCOCCUS AUREUS     Note: RIFAMPIN AND GENTAMICIN SHOULD NOT BE USED AS SINGLE DRUGS FOR TREATMENT OF STAPH INFECTIONS. This organism DOES NOT demonstrate inducible Clindamycin resistance in vitro. CRITICAL RESULT CALLED TO, READ BACK BY AND VERIFIED WITH: LAURA JAMA @      11:35AM 08/14/13 BY DWEEKS     ABUNDANT GROUP B STREP(S.AGALACTIAE)ISOLATED     Note: TESTING AGAINST S. AGALACTIAE NOT ROUTINELY PERFORMED DUE TO PREDICTABILITY OF AMP/PEN/VAN SUSCEPTIBILITY.     Performed at [facility]   Report Status 08/14/2013 FINAL   Final   Organism ID, Bacteria METHICILLIN RESISTANT STAPHYLOCOCCUS AUREUS   Final     Labs: Basic Metabolic Panel:  Recent Labs Lab 08/09/13 1230 08/10/13 0940 08/11/13 0722 08/12/13 0504 08/15/13 0613  NA 128* 139 135* 137 140  K 4.4 3.8 3.9 4.2 3.9  CL 90* 103 97 98 100  CO2 20 23 24 26 27   GLUCOSE 525* 236* 233* 297* 111*  BUN 13 11 11  11  12  CREATININE 0.54 0.48* 0.65 0.67 0.63  CALCIUM 9.5 7.9* 8.3* 9.1 8.9   Liver Function Tests:  Recent Labs Lab 08/09/13 1230  08/15/13 0613  AST 12 13  ALT 20 18  ALKPHOS 115 122*  BILITOT <0.2* 0.3  PROT 7.6 6.6  ALBUMIN 3.4* 2.7*   No results found for this basename: LIPASE, AMYLASE,  in the last 168 hours No results found for this basename: AMMONIA,  in the last 168 hours CBC:  Recent Labs Lab 08/09/13 1230  08/11/13 0722 08/12/13 0504 08/13/13 0735 08/14/13 0641 08/15/13 0613  WBC 18.4*  < > 15.3* 12.5* 11.8* 14.3* 10.0  NEUTROABS 14.2*  --   --   --   --   --   --   HGB 14.4  < > 12.4* 12.4* 11.4* 12.3* 11.8*  HCT 41.5  < > 38.6* 37.6* 34.7* 37.2* 37.0*  MCV 82.0  < > 85.8 83.7 83.6 84.2 85.3  PLT 432*  < > 382 402* 393 479* 457*  < > = values in this interval not displayed. Cardiac Enzymes: No results found for this basename: CKTOTAL, CKMB, CKMBINDEX, TROPONINI,  in the last 168 hours BNP: No components found with this basename: POCBNP,  CBG:  Recent Labs Lab 08/14/13 0626 08/14/13 1129 08/14/13 1637 08/14/13 2048 08/15/13 0644  GLUCAP 169* 156* 208* 176* 117*    Time coordinating discharge:  Greater than 30 minutes  Signed:  Corrie Brannen, DO [facility] Pager: [phone] 08/15/2013, 1:29 PM

## 2013-08-15 NOTE — Progress Notes (Signed)
Discharge instructions given. Pt verbalized understanding and all questions were answered.  

## 2013-08-15 NOTE — Progress Notes (Signed)
Patient was experiencing a lot of lower flank pain and floor coverage was called twice with new orders given.

## 2013-08-15 NOTE — Progress Notes (Signed)
PT SEEN/EXAMINED HAND LOOKED MUCH BETTER YESTERDAY WOUND REDRESSED YESTERDAY OK TO GO HOME ON PO ABX I NEED TO SEE BACK IN ONE WEEK IN MY OFFICE TO LOOK AT WOUNDS KEEP BANDAGE ON AT ALL TIMES-

## 2016-01-06 IMAGING — CR DG FINGER MIDDLE 2+V*R*
3 series · 3 of 3 positions shown · non-contrast
Comparison: None.

CLINICAL DATA: HAND PAIN

EXAM:
RIGHT MIDDLE FINGER 2+V

[x finger pa right]
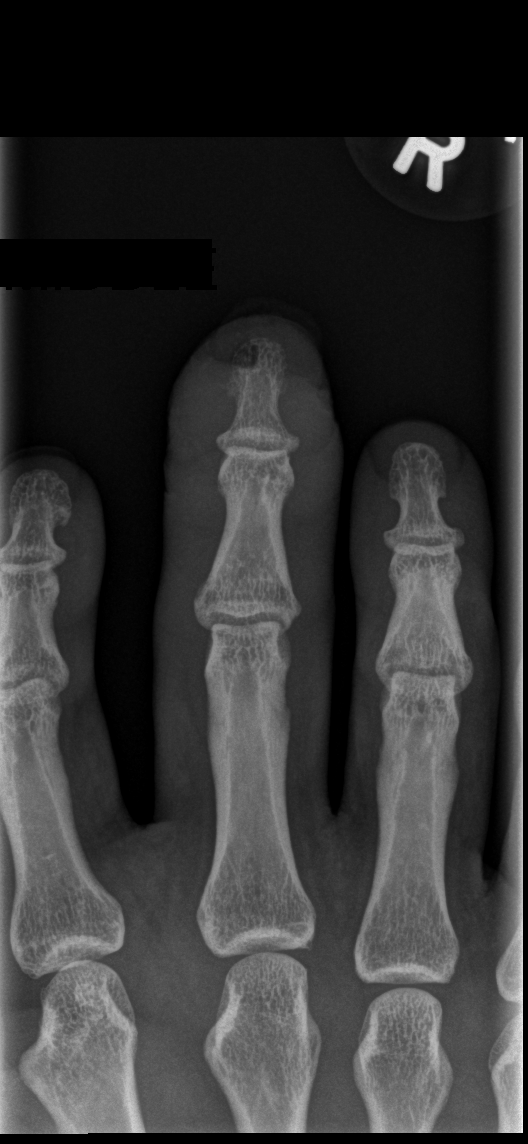

[x finger obl right]
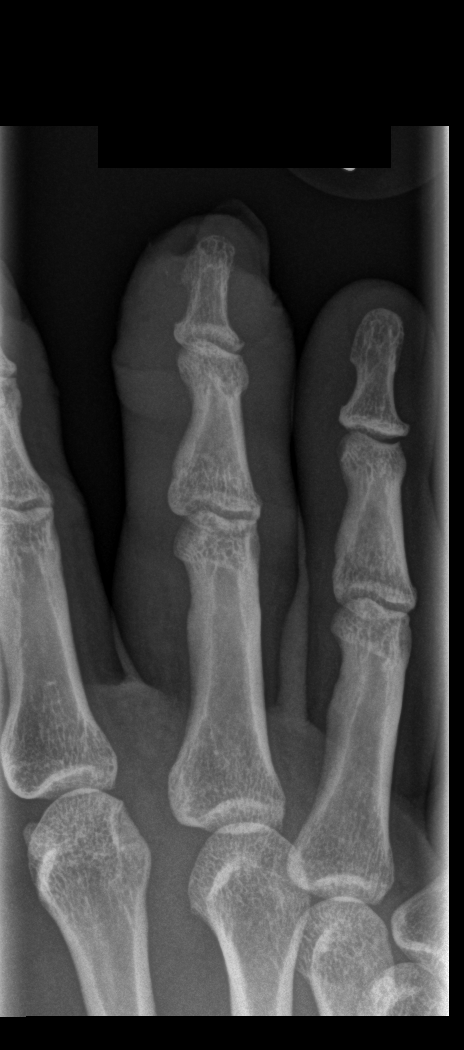

[x finger lat right]
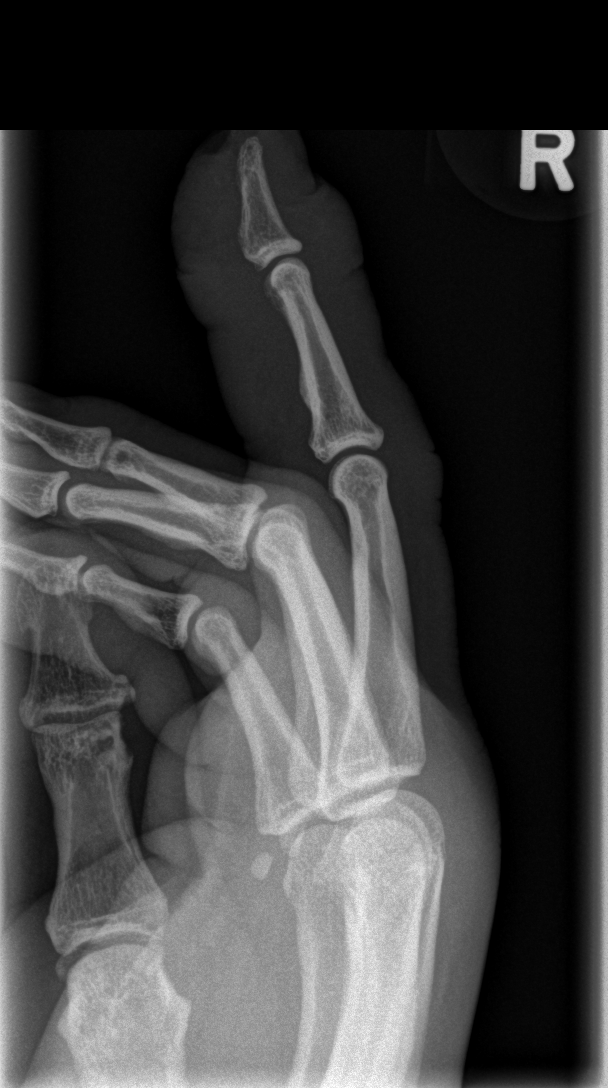

[3 of 3 positions shown; findings below may reference images not displayed]

FINDINGS: There is no evidence of fracture or dislocation. The cortical
margins appear intact. There is no evidence of arthropathy or other
focal bone abnormality. A soft tissue ulceration along the distal
tip of the third digit.
IMPRESSION: Soft tissue ulceration overlying the distal tuft of the third digit.
There is no evidence of cortical destruction nor acute osseous
abnormalities.

## 2016-01-11 IMAGING — CT CT ABD-PELV W/O CM
2 of 4 series · 16 of 46 positions shown, 18 images · non-contrast
Comparison: None.

CLINICAL DATA: Right flank pain.  Evaluate for nephrolithiasis.

EXAM:
CT ABDOMEN AND PELVIS WITHOUT CONTRAST
TECHNIQUE: Multidetector CT imaging of the abdomen and pelvis was performed
following the standard protocol without IV contrast.

[Series 2: stone study 5.0 i30f 1 · axial · 0.95mm/px · z∈[-507,-22]mm · 13 of 107 slices shown, 15 images]
[im 5/107  soft-tissue]
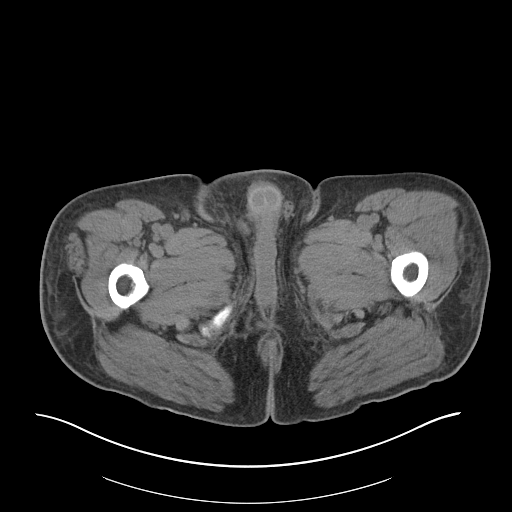
[im 5/107  bone]
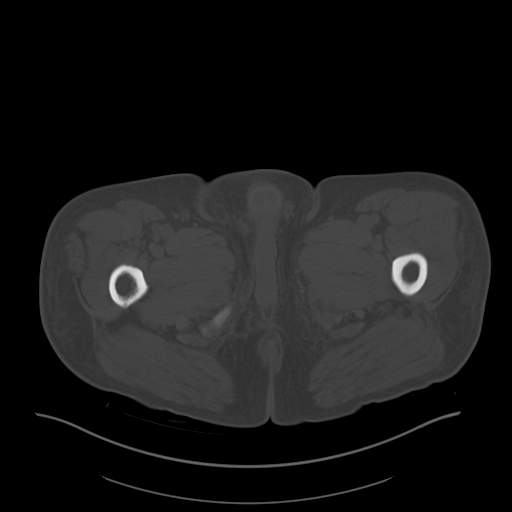
[im 14/107  soft-tissue]
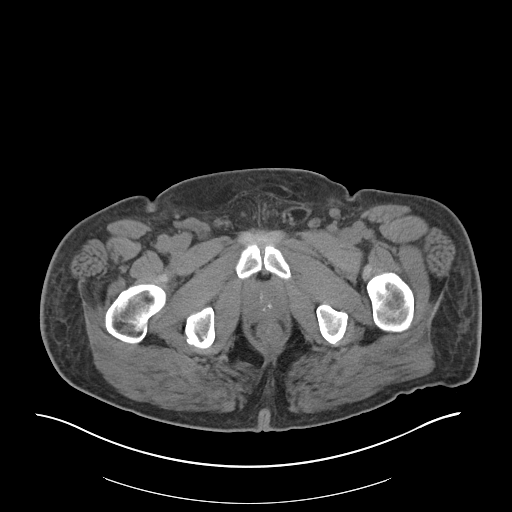
[im 23/107  soft-tissue]
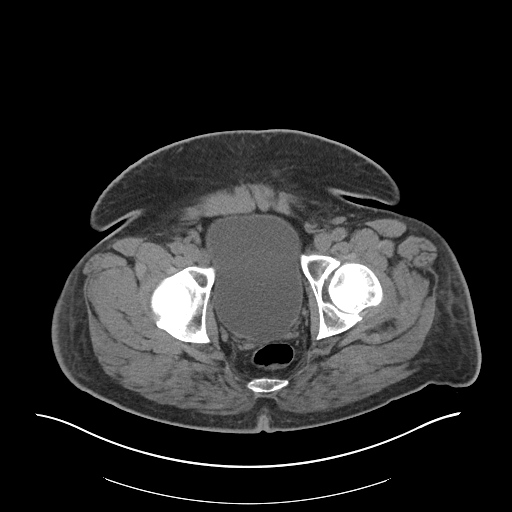
[im 31/107  soft-tissue]
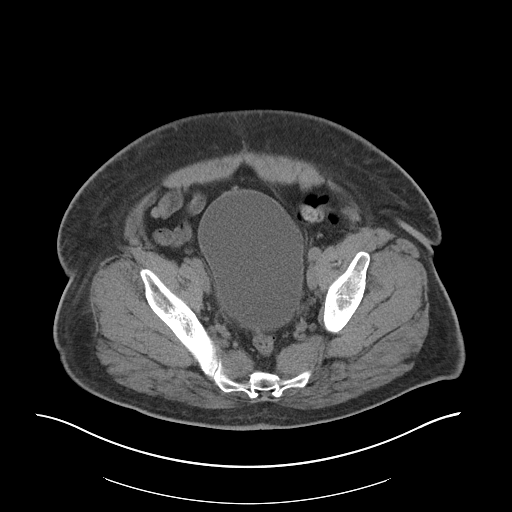
[im 36/107  soft-tissue]
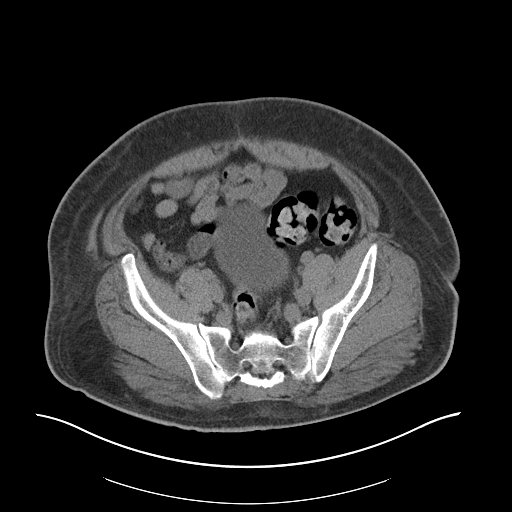
[im 45/107  soft-tissue]
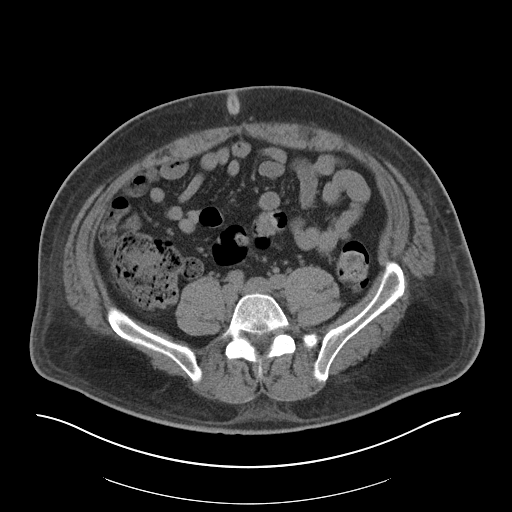
[im 54/107  soft-tissue]
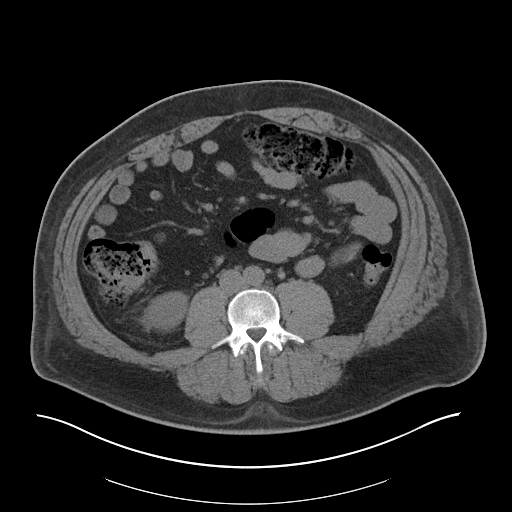
[im 62/107  soft-tissue]
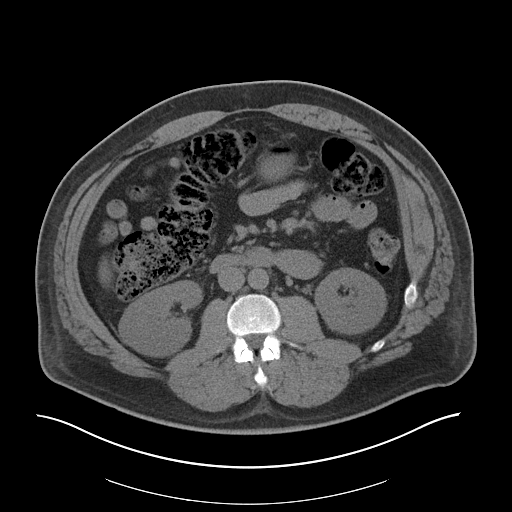
[im 71/107  soft-tissue]
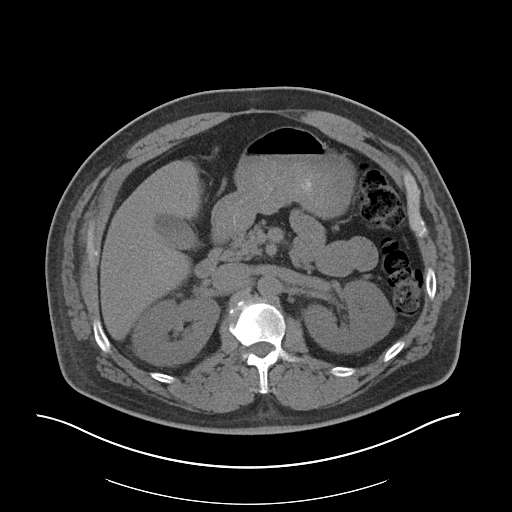
[im 71/107  bone]
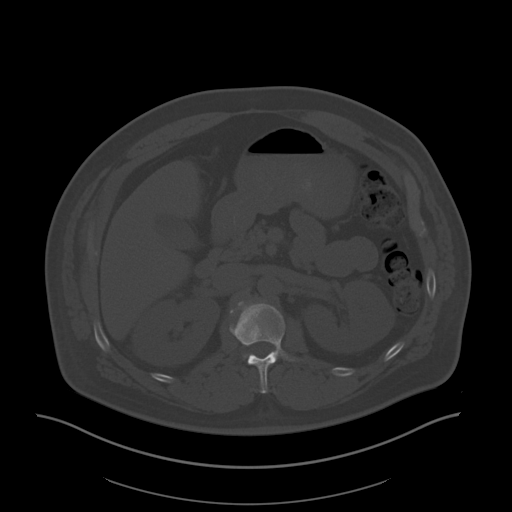
[im 76/107  soft-tissue]
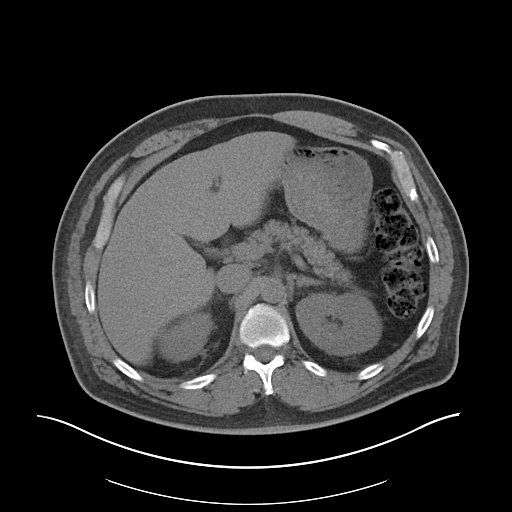
[im 84/107  soft-tissue]
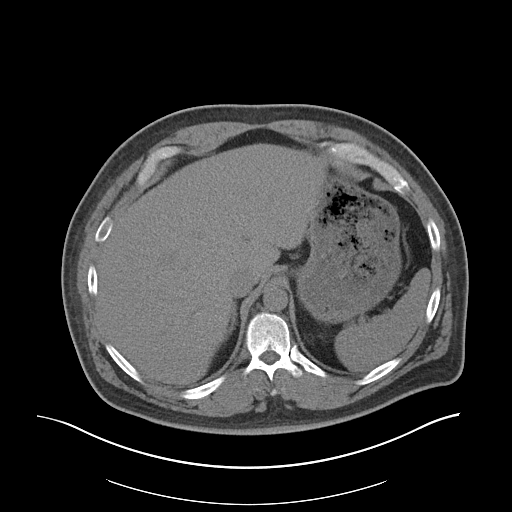
[im 93/107  soft-tissue]
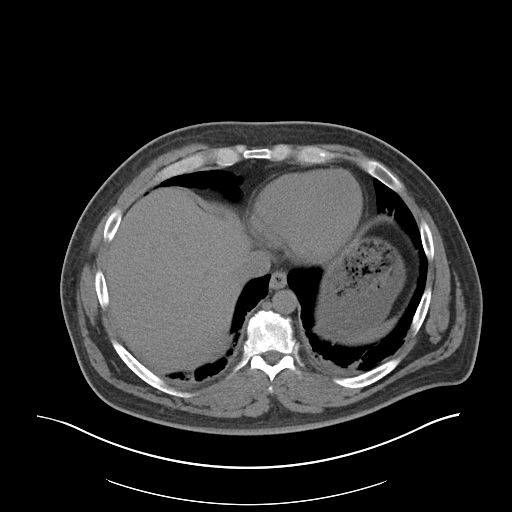
[im 102/107  soft-tissue]
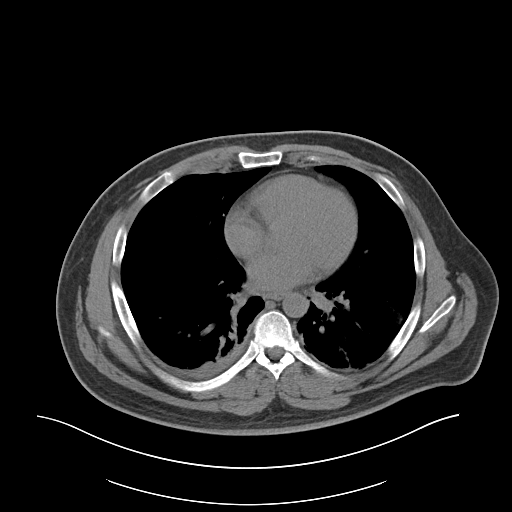

[Series 5: coronal soft tissue · coronal · 0.99mm/px · 3 of 105 slices shown]
[im 35/105  soft-tissue]
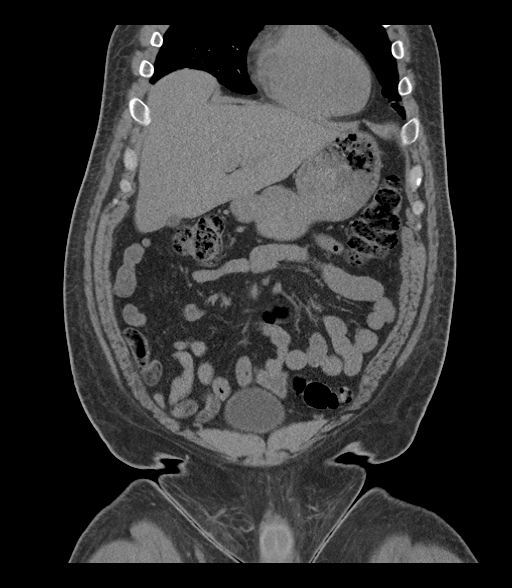
[im 47/105  soft-tissue]
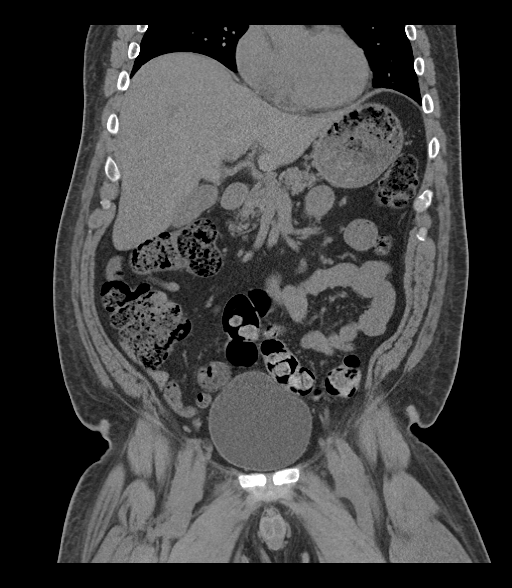
[im 58/105  soft-tissue]
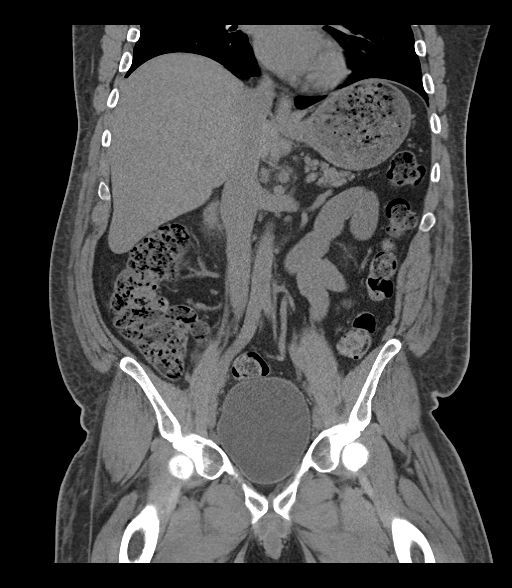

[16 of 46 positions shown; findings below may reference images not displayed]

FINDINGS: Lung Bases: Dependent bibasilar collapse/ consolidation is evident.
Left-sided gynecomastia is evident. The right nipple has not been
included on the study.

Liver:  Normal uninfused appearance.

Spleen: Normal uninfused appearance.

Stomach: Distended with food.

Pancreas: No focal mass lesion.  No dilatation of the main duct.

Gallbladder/Biliary: Layering high attenuation suggests sludge. No
definite gallstones. No intra or extrahepatic biliary duct
dilatation.

Kidneys/Adrenals: No adrenal nodule or mass. No evidence for stone
disease in either kidney. No hydronephrosis or perinephric edema/
inflammation. No hydroureter. No ureteral stones. No bladder stones.

Bowel Loops: Duodenum and ligament of Treitz are in their expected
anatomic locations. No small bowel obstruction. Terminal ileum is
normal. The appendix is normal. No colonic diverticulitis. No gross
colonic mass lesion.

Nodes: No abdominal lymphadenopathy. No evidence for pelvic sidewall
lymphadenopathy.

Vasculature: No abdominal aortic aneurysm.

Pelvic Genitourinary: Bladder is distended. Scattered dystrophic
calcification is seen in the prostate gland.

Bones/Musculoskeletal: Bone windows reveal no worrisome lytic or
sclerotic osseous lesions.

Body Wall: No evidence for ventral hernia. Gas bubbles in the
subcutaneous fat of the right anterior abdominal wall are presumably
from an injection. There is a small left inguinal hernia containing
only fat. Subcutaneous edema is seen in the lower abdominal wall in
pelvis.

Other: No evidence for intraperitoneal free fluid
IMPRESSION: 1. No CT findings to explain the patient's history of right flank
pain.
2. Gynecomastia.
3. Small left inguinal hernia contains only fat.
4. Mild body wall edema in the lower abdomen and pelvis.
